# Patient Record
Sex: Female | Born: 1985 | Hispanic: Yes | Marital: Married | Smoking: Former smoker
Health system: Southern US, Community
[De-identification: ages and names within clinical notes are randomized; demographics above are authoritative.]

---

## 2006-11-23 ENCOUNTER — Encounter (INDEPENDENT_AMBULATORY_CARE_PROVIDER_SITE_OTHER): Payer: Self-pay | Admitting: Family Medicine

## 2006-11-23 ENCOUNTER — Ambulatory Visit: Payer: Self-pay | Admitting: Family Medicine

## 2006-11-23 LAB — CONVERTED CEMR LAB
Antibody Screen: NEGATIVE
Eosinophils Absolute: 0 10*3/uL (ref 0.0–0.7)
Eosinophils Relative: 0 % (ref 0–5)
HCT: 38.7 % (ref 36.0–46.0)
Hemoglobin: 13.1 g/dL (ref 12.0–15.0)
Hepatitis B Surface Ag: NEGATIVE
Lymphocytes Relative: 25 % (ref 12–46)
Lymphs Abs: 2.4 10*3/uL (ref 0.7–3.3)
MCV: 90.4 fL (ref 78.0–100.0)
Monocytes Absolute: 0.7 10*3/uL (ref 0.2–0.7)
Monocytes Relative: 8 % (ref 3–11)
RBC: 4.28 M/uL (ref 3.87–5.11)
Rh Type: POSITIVE
WBC: 9.6 10*3/uL (ref 4.0–10.5)

## 2006-11-30 ENCOUNTER — Ambulatory Visit: Payer: Self-pay | Admitting: Family Medicine

## 2006-11-30 ENCOUNTER — Encounter (INDEPENDENT_AMBULATORY_CARE_PROVIDER_SITE_OTHER): Payer: Self-pay | Admitting: Family Medicine

## 2006-11-30 LAB — CONVERTED CEMR LAB
Chlamydia, DNA Probe: NEGATIVE
GC Probe Amp, Genital: NEGATIVE

## 2006-12-04 ENCOUNTER — Ambulatory Visit (HOSPITAL_COMMUNITY): Admission: RE | Admit: 2006-12-04 | Discharge: 2006-12-04 | Payer: Self-pay | Admitting: Family Medicine

## 2007-01-01 ENCOUNTER — Ambulatory Visit: Payer: Self-pay | Admitting: Family Medicine

## 2007-01-22 ENCOUNTER — Ambulatory Visit (HOSPITAL_COMMUNITY): Admission: RE | Admit: 2007-01-22 | Discharge: 2007-01-22 | Payer: Self-pay | Admitting: Family Medicine

## 2007-01-23 ENCOUNTER — Telehealth (INDEPENDENT_AMBULATORY_CARE_PROVIDER_SITE_OTHER): Payer: Self-pay | Admitting: Family Medicine

## 2007-01-23 ENCOUNTER — Encounter (INDEPENDENT_AMBULATORY_CARE_PROVIDER_SITE_OTHER): Payer: Self-pay | Admitting: Family Medicine

## 2007-02-18 ENCOUNTER — Ambulatory Visit: Payer: Self-pay | Admitting: Family Medicine

## 2007-04-04 ENCOUNTER — Ambulatory Visit: Payer: Self-pay | Admitting: Family Medicine

## 2007-04-04 ENCOUNTER — Encounter (INDEPENDENT_AMBULATORY_CARE_PROVIDER_SITE_OTHER): Payer: Self-pay | Admitting: *Deleted

## 2007-04-17 ENCOUNTER — Ambulatory Visit: Payer: Self-pay | Admitting: Family Medicine

## 2007-05-03 ENCOUNTER — Encounter: Payer: Self-pay | Admitting: *Deleted

## 2007-05-16 ENCOUNTER — Ambulatory Visit: Payer: Self-pay | Admitting: Family Medicine

## 2007-05-16 DIAGNOSIS — R5383 Other fatigue: Secondary | ICD-10-CM

## 2007-05-16 DIAGNOSIS — R5381 Other malaise: Secondary | ICD-10-CM

## 2007-05-22 ENCOUNTER — Encounter (INDEPENDENT_AMBULATORY_CARE_PROVIDER_SITE_OTHER): Payer: Self-pay | Admitting: Family Medicine

## 2007-05-22 ENCOUNTER — Ambulatory Visit: Payer: Self-pay | Admitting: Family Medicine

## 2007-05-22 LAB — CONVERTED CEMR LAB: Chlamydia, DNA Probe: NEGATIVE

## 2007-05-28 ENCOUNTER — Telehealth: Payer: Self-pay | Admitting: *Deleted

## 2007-05-30 ENCOUNTER — Encounter (INDEPENDENT_AMBULATORY_CARE_PROVIDER_SITE_OTHER): Payer: Self-pay | Admitting: Family Medicine

## 2007-05-31 ENCOUNTER — Ambulatory Visit: Payer: Self-pay | Admitting: Family Medicine

## 2007-05-31 LAB — CONVERTED CEMR LAB: Glucose, Urine, Semiquant: NEGATIVE

## 2007-06-04 ENCOUNTER — Encounter: Payer: Self-pay | Admitting: Family Medicine

## 2007-06-04 ENCOUNTER — Ambulatory Visit: Payer: Self-pay | Admitting: Sports Medicine

## 2007-06-04 ENCOUNTER — Encounter: Payer: Self-pay | Admitting: *Deleted

## 2007-06-04 LAB — CONVERTED CEMR LAB
Glucose, Urine, Semiquant: NEGATIVE
Protein, U semiquant: NEGATIVE

## 2007-06-10 ENCOUNTER — Telehealth (INDEPENDENT_AMBULATORY_CARE_PROVIDER_SITE_OTHER): Payer: Self-pay | Admitting: Family Medicine

## 2007-06-12 ENCOUNTER — Inpatient Hospital Stay (HOSPITAL_COMMUNITY): Admission: AD | Admit: 2007-06-12 | Discharge: 2007-06-12 | Payer: Self-pay | Admitting: Obstetrics and Gynecology

## 2007-06-12 ENCOUNTER — Ambulatory Visit: Payer: Self-pay | Admitting: Family Medicine

## 2007-06-12 ENCOUNTER — Ambulatory Visit: Payer: Self-pay | Admitting: Obstetrics and Gynecology

## 2007-06-12 LAB — CONVERTED CEMR LAB

## 2007-06-14 ENCOUNTER — Encounter: Payer: Self-pay | Admitting: *Deleted

## 2007-06-20 ENCOUNTER — Ambulatory Visit: Payer: Self-pay | Admitting: Family Medicine

## 2007-06-20 ENCOUNTER — Ambulatory Visit: Payer: Self-pay | Admitting: Gynecology

## 2007-06-20 ENCOUNTER — Inpatient Hospital Stay (HOSPITAL_COMMUNITY): Admission: AD | Admit: 2007-06-20 | Discharge: 2007-06-23 | Payer: Self-pay | Admitting: Family Medicine

## 2007-08-07 ENCOUNTER — Ambulatory Visit: Payer: Self-pay | Admitting: Family Medicine

## 2007-08-07 DIAGNOSIS — N898 Other specified noninflammatory disorders of vagina: Secondary | ICD-10-CM | POA: Insufficient documentation

## 2007-08-07 LAB — CONVERTED CEMR LAB

## 2007-09-06 ENCOUNTER — Telehealth (INDEPENDENT_AMBULATORY_CARE_PROVIDER_SITE_OTHER): Payer: Self-pay | Admitting: Family Medicine

## 2008-01-03 ENCOUNTER — Encounter (INDEPENDENT_AMBULATORY_CARE_PROVIDER_SITE_OTHER): Payer: Self-pay | Admitting: Family Medicine

## 2008-01-03 ENCOUNTER — Ambulatory Visit: Payer: Self-pay | Admitting: Family Medicine

## 2008-01-03 DIAGNOSIS — N912 Amenorrhea, unspecified: Secondary | ICD-10-CM

## 2008-01-03 DIAGNOSIS — R634 Abnormal weight loss: Secondary | ICD-10-CM

## 2008-01-03 LAB — CONVERTED CEMR LAB: Whiff Test: NEGATIVE

## 2008-01-08 LAB — CONVERTED CEMR LAB
ALT: 14 units/L (ref 0–35)
AST: 15 units/L (ref 0–37)
Albumin: 4.5 g/dL (ref 3.5–5.2)
Alkaline Phosphatase: 136 units/L — ABNORMAL HIGH (ref 39–117)
BUN: 10 mg/dL (ref 6–23)
CO2: 22 meq/L (ref 19–32)
Calcium: 9 mg/dL (ref 8.4–10.5)
Chlamydia, DNA Probe: NEGATIVE
Chloride: 106 meq/L (ref 96–112)
Creatinine, Ser: 0.48 mg/dL (ref 0.40–1.20)
GC Probe Amp, Genital: NEGATIVE
Glucose, Bld: 86 mg/dL (ref 70–99)
HCT: 41.6 % (ref 36.0–46.0)
Hemoglobin: 13.6 g/dL (ref 12.0–15.0)
MCHC: 32.7 g/dL (ref 30.0–36.0)
MCV: 91.8 fL (ref 78.0–100.0)
Platelets: 291 10*3/uL (ref 150–400)
Potassium: 3.8 meq/L (ref 3.5–5.3)
RBC: 4.53 M/uL (ref 3.87–5.11)
RDW: 13.8 % (ref 11.5–15.5)
Sodium: 140 meq/L (ref 135–145)
TSH: 0.963 microintl units/mL (ref 0.350–4.50)
Total Bilirubin: 0.4 mg/dL (ref 0.3–1.2)
Total Protein: 7.7 g/dL (ref 6.0–8.3)
WBC: 13.2 10*3/uL — ABNORMAL HIGH (ref 4.0–10.5)

## 2008-01-29 ENCOUNTER — Encounter (INDEPENDENT_AMBULATORY_CARE_PROVIDER_SITE_OTHER): Payer: Self-pay | Admitting: Family Medicine

## 2008-06-22 ENCOUNTER — Encounter (INDEPENDENT_AMBULATORY_CARE_PROVIDER_SITE_OTHER): Payer: Self-pay | Admitting: Family Medicine

## 2009-04-21 ENCOUNTER — Ambulatory Visit (HOSPITAL_COMMUNITY): Admission: RE | Admit: 2009-04-21 | Discharge: 2009-04-21 | Payer: Self-pay | Admitting: Obstetrics & Gynecology

## 2009-06-26 ENCOUNTER — Inpatient Hospital Stay (HOSPITAL_COMMUNITY): Admission: AD | Admit: 2009-06-26 | Discharge: 2009-06-26 | Payer: Self-pay | Admitting: Obstetrics & Gynecology

## 2009-06-26 ENCOUNTER — Ambulatory Visit: Payer: Self-pay | Admitting: Advanced Practice Midwife

## 2009-06-27 ENCOUNTER — Inpatient Hospital Stay (HOSPITAL_COMMUNITY): Admission: AD | Admit: 2009-06-27 | Discharge: 2009-06-29 | Payer: Self-pay | Admitting: Obstetrics & Gynecology

## 2009-06-27 ENCOUNTER — Ambulatory Visit: Payer: Self-pay | Admitting: Obstetrics and Gynecology

## 2009-12-16 IMAGING — US US OB COMP +14 WK
2 series · 14 of 28 positions shown · non-contrast
Comparison: none

OBSTETRICAL ULTRASOUND:
 This ultrasound exam was performed in the [HOSPITAL] Ultrasound Department.  The OB US report was generated in the AS system, and faxed to the ordering physician.  This report is also available in [HOSPITAL]?s AccessANYware and in [REDACTED] PACS.

[Series 1: us ob comp +14 wk · 13 of 61 slices shown (1 of 2)]
[im 3/61]
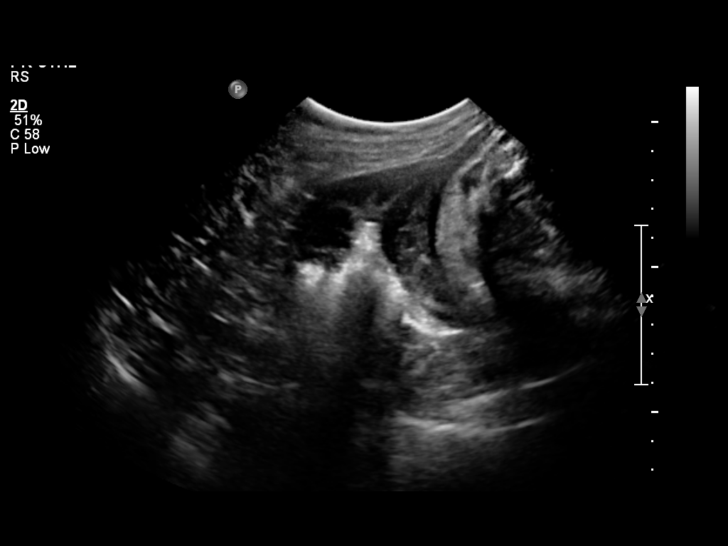
[im 7/61]
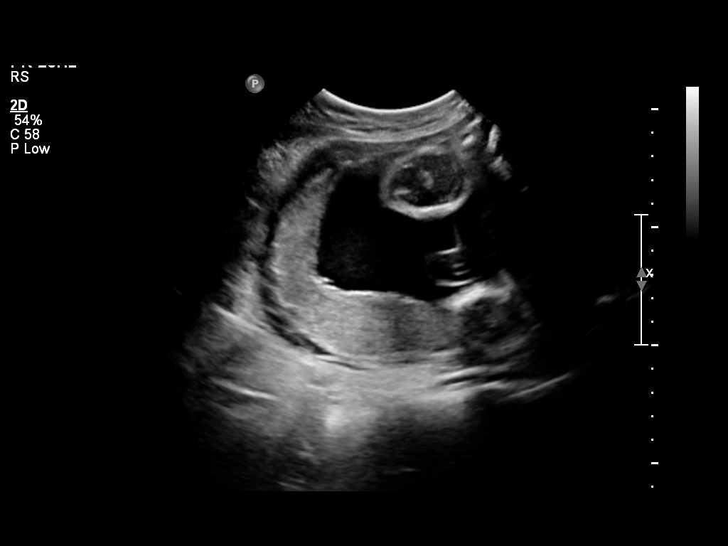
[im 12/61]
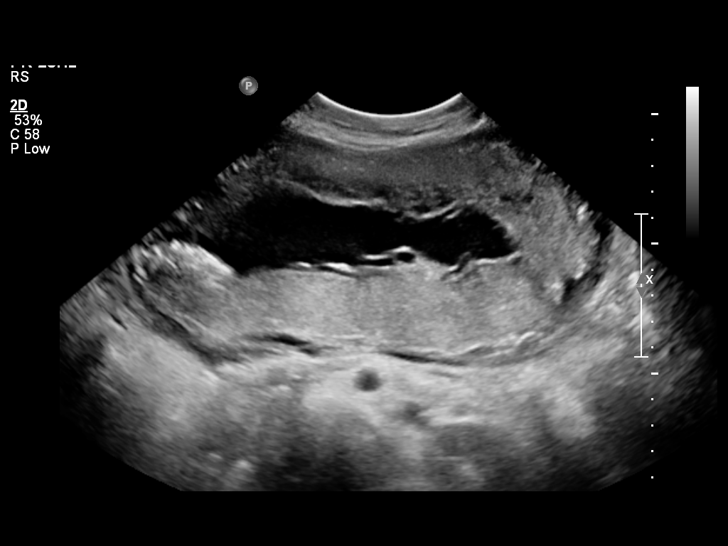
[im 17/61]
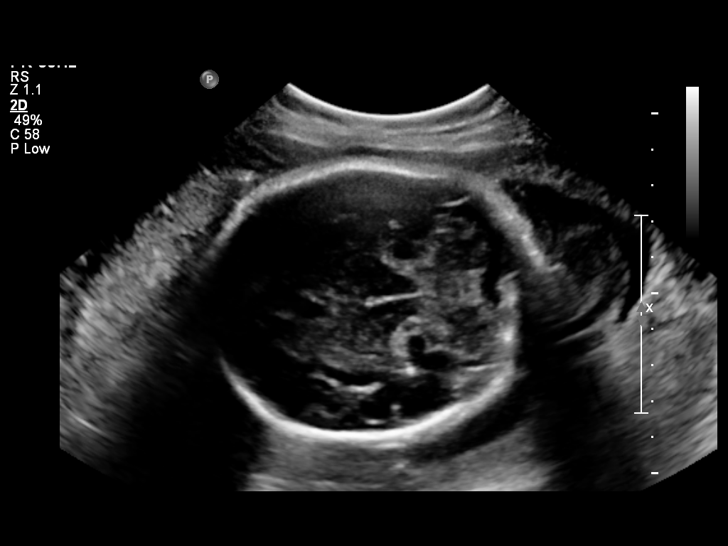
[im 21/61]
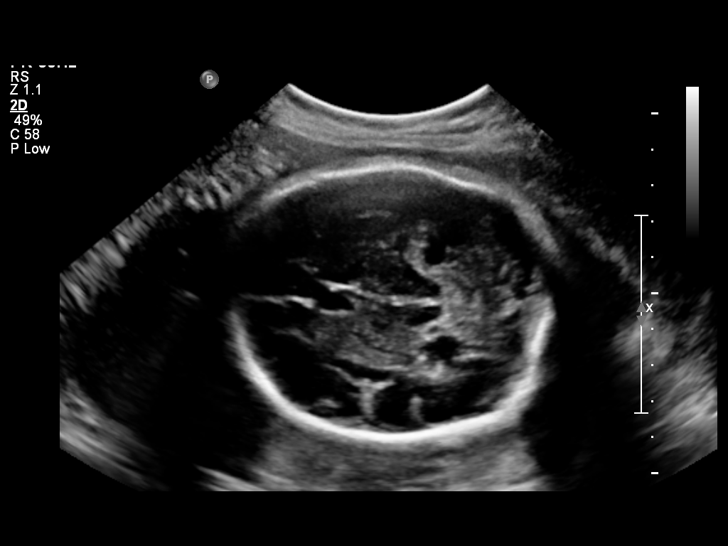
[im 26/61]
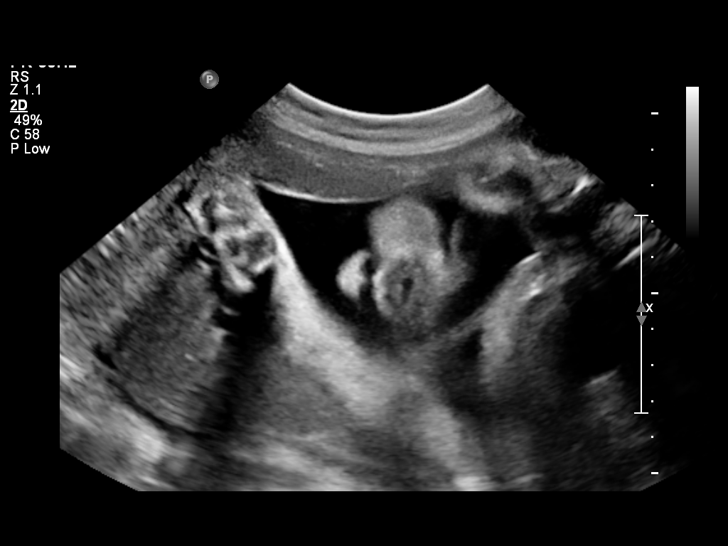
[im 30/61]
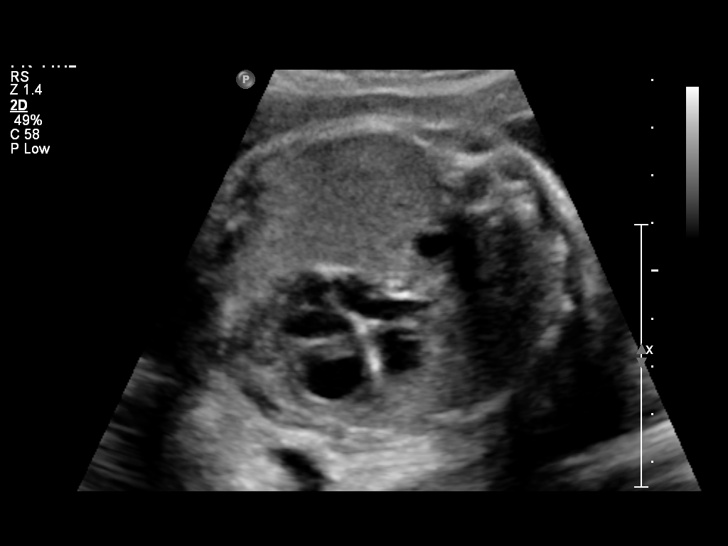
[im 35/61]
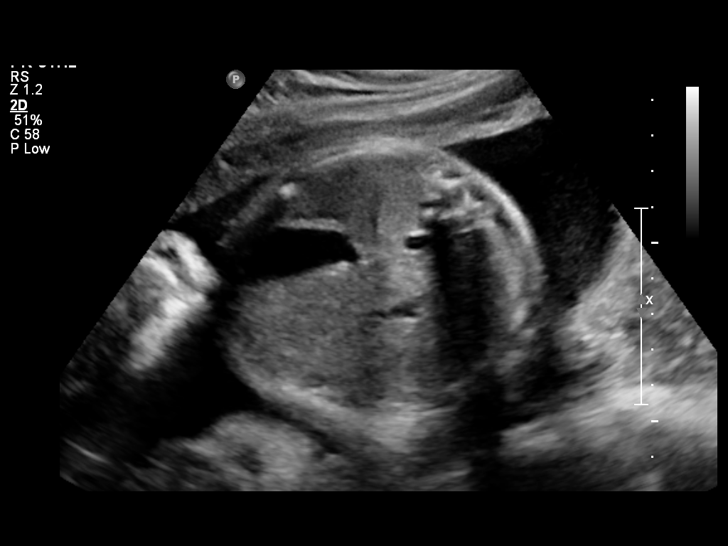
[im 40/61]
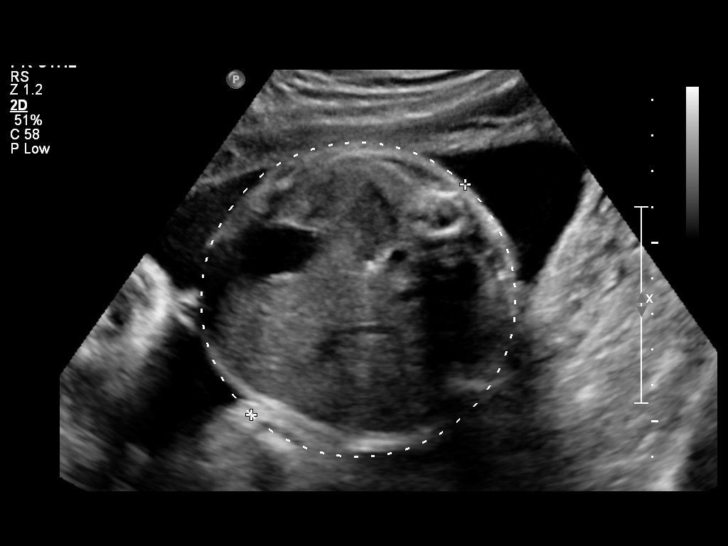
[im 44/61]
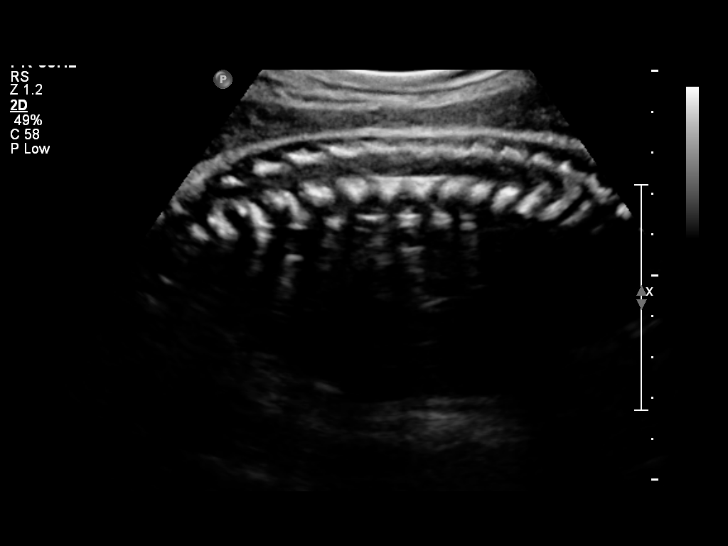
[im 49/61]
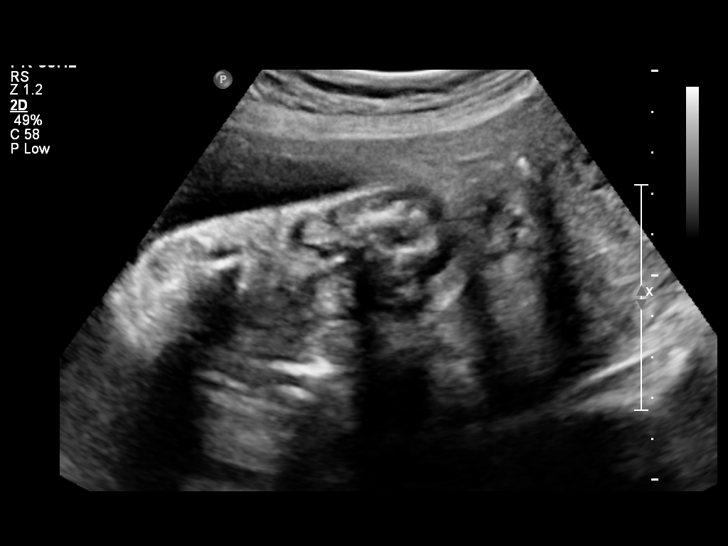
[im 54/61]
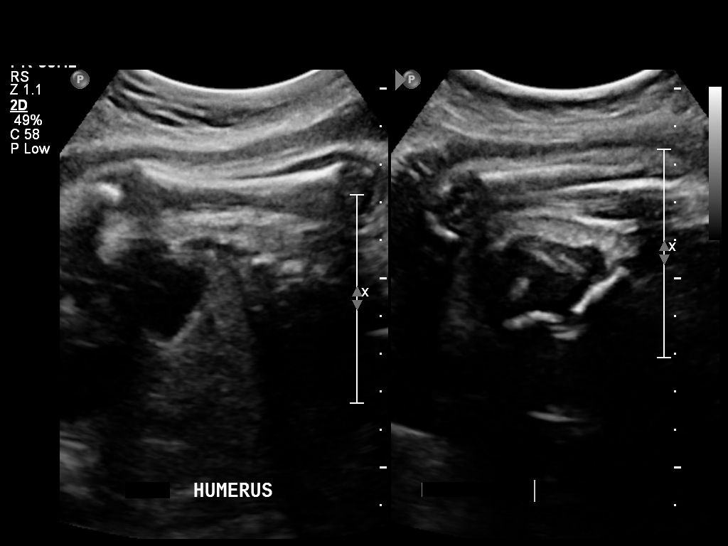
[im 58/61]
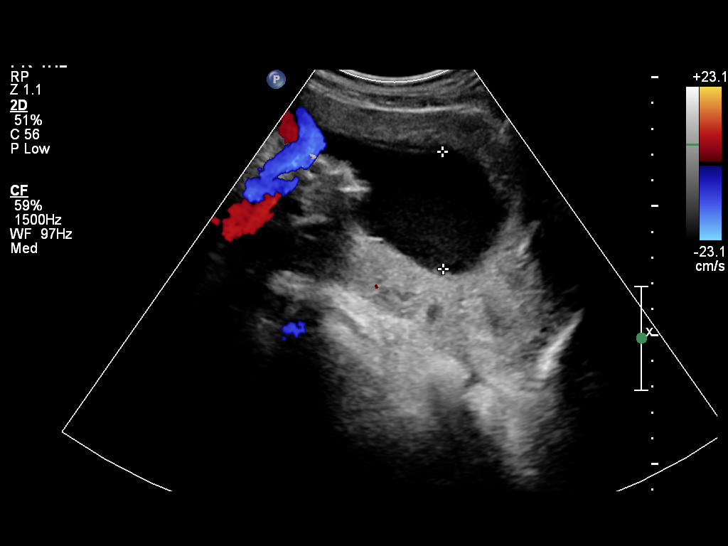

[Series 1: us ob comp +14 wk · 1 of 2 slices shown (2 of 2)]
[im 1/2]
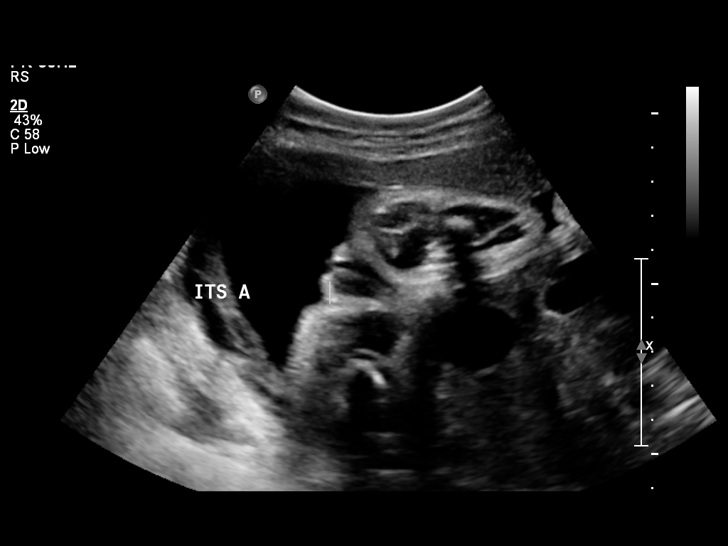

[14 of 28 positions shown; findings below may reference images not displayed]

IMPRESSION: See AS Obstetric US report.

## 2010-08-28 LAB — CBC
Hemoglobin: 10.6 g/dL — ABNORMAL LOW (ref 12.0–15.0)
Platelets: 182 10*3/uL (ref 150–400)
RBC: 3.37 MIL/uL — ABNORMAL LOW (ref 3.87–5.11)
RBC: 4.11 MIL/uL (ref 3.87–5.11)
RDW: 15.4 % (ref 11.5–15.5)
WBC: 12.6 10*3/uL — ABNORMAL HIGH (ref 4.0–10.5)
WBC: 13.7 10*3/uL — ABNORMAL HIGH (ref 4.0–10.5)

## 2010-08-28 LAB — RPR: RPR Ser Ql: NONREACTIVE

## 2011-03-02 LAB — CBC
HCT: 30.5 — ABNORMAL LOW
HCT: 40.4
Hemoglobin: 14.4
MCHC: 35
MCHC: 35.6
MCV: 93.3
MCV: 93.3
Platelets: 131 — ABNORMAL LOW
RBC: 4.33
WBC: 13.3 — ABNORMAL HIGH

## 2011-06-13 NOTE — L&D Delivery Note (Signed)
Delivery Summary for Primitivo Gauze  Labor Events:   Preterm labor:   Rupture date:   Rupture time:   Rupture type: Artificial  Fluid Color:   Induction:   Augmentation:   Complications:   Cervical ripening:          Delivery:   Episiotomy:   Lacerations:   Repair suture:   Repair # of packets:   Blood loss (ml):    Information for the patient's newborn:  Holland, Kottler [[id number]]    Delivery 05/13/2012 1:50 PM by  Vaginal, Spontaneous Delivery Sex:  female Gestational Age: <None> Delivery Clinician:  Danae Orleans Living?: Yes        APGARS  One minute Five minutes Ten minutes  Skin color:        Heart rate:        Grimace:        Muscle tone:        Breathing:        Totals:         Presentation/position: Vertex   Occiput Anterior Resuscitation: None  Cord information: 3 vessels   Disposition of cord blood: No    Blood gases sent? No Complications: None  Placenta: Delivered: 05/13/2012 1:54 PM  Spontaneous  Intact appearance Newborn Measurements: Weight:   Height:   Head circumference:   Chest circumference:   Other providers: Delivery Nurse Deliah Boston  Additional  information: Forceps:   Vacuum:   Breech:   Observed anomalies        Delivery Note C/C +2 at 1236 and had trial of pushing with poor effort. At 1315 began pushing with descent. At 1:50 PM a viable and healthy female was delivered via Vaginal, Spontaneous Delivery (Presentation: ; Occiput Anterior).  APGAR: , ; weight .   Placenta status: Intact, Spontaneous.  Trailing membranes and fragment manually removed. Cord: 3 vessels with the following complications: None.    Anesthesia: Epidural  Episiotomy: None Lacerations: None Suture Repair: n/a Est. Blood Loss (mL): 200  Mom to postpartum.  Baby to nursery-stable.  Skyy Nilan 05/13/2012, 2:01 PM

## 2012-05-01 ENCOUNTER — Ambulatory Visit (INDEPENDENT_AMBULATORY_CARE_PROVIDER_SITE_OTHER): Payer: Self-pay | Admitting: Obstetrics and Gynecology

## 2012-05-01 ENCOUNTER — Encounter: Payer: Self-pay | Admitting: Obstetrics and Gynecology

## 2012-05-01 VITALS — BP 110/72

## 2012-05-01 DIAGNOSIS — O2390 Unspecified genitourinary tract infection in pregnancy, unspecified trimester: Secondary | ICD-10-CM

## 2012-05-01 DIAGNOSIS — O0930 Supervision of pregnancy with insufficient antenatal care, unspecified trimester: Secondary | ICD-10-CM

## 2012-05-01 DIAGNOSIS — Z3480 Encounter for supervision of other normal pregnancy, unspecified trimester: Secondary | ICD-10-CM

## 2012-05-01 DIAGNOSIS — Z23 Encounter for immunization: Secondary | ICD-10-CM

## 2012-05-01 LAB — POCT URINALYSIS DIP (DEVICE)
Hgb urine dipstick: NEGATIVE
Protein, ur: NEGATIVE mg/dL
Specific Gravity, Urine: 1.01 (ref 1.005–1.030)
Urobilinogen, UA: 0.2 mg/dL (ref 0.0–1.0)
pH: 6.5 (ref 5.0–8.0)

## 2012-05-01 MED ORDER — INFLUENZA VIRUS VACC SPLIT PF IM SUSP
0.5000 mL | Freq: Once | INTRAMUSCULAR | Status: AC
  Administered 2012-05-01: 0.5 mL via INTRAMUSCULAR

## 2012-05-01 NOTE — Progress Notes (Signed)
Here for initial ob visit. Referred her from [facility]. Not sure of LMP- sometime in February or March.  States noticed leaking milk, nausea and no period so she knew she was pregnant. Will do upt today to verify pregnancy. C/o intermittent pelvic pressure and feels "like a palpation and it lasts about 5 minutes. Used Surveyor, quantity . Given new patient information. Patient unsure of prepregnancy weight.

## 2012-05-01 NOTE — Patient Instructions (Signed)
Embarazo  Tercer trimestre  (Pregnancy - Third Trimester) El tercer trimestre del embarazo (los ltimos 3 meses) es el perodo en el cual tanto usted como su beb crecen con ms rapidez. El beb alcanza un largo de aproximadamente 50 cm. y pesa entre 2,700 y 4,500 kg. El beb gana ms tejido graso y est listo para la vida fuera del cuerpo de la madre. Mientras estn en el interior, los bebs tienen perodos de sueo y vigilia, succionan el pulgar y tienen hipo. Quizs sienta pequeas contracciones del tero. Este es el falso trabajo de parto. Tambin se las conoce como contracciones de Braxton-Hicks . Es como una prctica del parto. Los problemas ms habituales de esta etapa del embarazo incluyen mayor dificultad para respirar, hinchazn de las manos y los pies por retencin de lquidos y la necesidad de orinar con ms frecuencia debido a que el tero y el beb presionan sobre la vejiga.  EXAMENES PRENATALES   Durante los exmenes prenatales, deber seguir realizndose anlisis de sangre. Estas pruebas se realizan para controlar su salud y la del beb. Los anlisis de sangre se realizan para conocer los niveles de algunos compuestos de la sangre (hemoglobina). La anemia (bajo nivel de hemoglobina) es frecuente durante el embarazo. Para prevenirla, se administran hierro y vitaminas. Tambin le tomarn nuevas anlisis para descartar diabetes. Podrn repetirle algunas de las pruebas que le hicieron previamente.  En cada visita le medirn el tamao del tero. Esto permite asegurar que el beb se desarrolla adecuadamente, segn la fecha del embarazo.  Le controlarn la presin arterial en cada visita prenatal. Esto es para asegurarse de que no sufre toxemia.  Le harn un anlisis de orina en cada visita prenatal, para descartar infecciones, diabetes y la presencia de protenas.  Tambin en cada visita controlarn su peso. Esto se realiza para asegurarse que aumenta de peso al ritmo indicado y que usted y su  beb evolucionan normalmente.  En algunas ocasiones se realiza una prueba de ultrasonido para confirmar el correcto desarrollo y evolucin del beb. Esta prueba se realiza con ondas sonoras inofensivas para el beb, de modo que el profesional pueda calcular ms precisamente la fecha del parto.  Analice con su mdico los analgsicos y la anestesia que recibir durante el trabajo de parto y el parto.  Comente la posibilidad de que necesite una cesrea y qu anestesia se recibir.  Informe a su mdico si sufre violencia familiar mental o fsica. A veces, se indica la prueba especializada sin estrs, la prueba de tolerancia a las contracciones y el perfil biofsico para asegurarse de que el beb no tiene problemas. El estudio del lquido amnitico que rodea al beb se llama amniocentesis. El lquido amnitico se obtiene introduciendo una aguja en el vientre (abdomen ). En ocasiones se lleva a cabo cerca del final del embarazo, si es necesario inducir a un parto. En este caso se realiza para asegurarse que los pulmones del beb estn lo suficientemente maduros como para que pueda vivir fuera del tero. Si los pulmones no han madurado y es peligroso que el beb nazca, se administrar a la madre una inyeccin de cortisona , 1 a 2 das antes del parto. . Esto ayuda a que los pulmones del beb maduren y sea ms seguro su nacimiento.  CAMBIOS QUE OCURREN EN EL TERCER TRIMESTRE DEL EMBARAZO  Su organismo atravesar numerosos cambios durante el embarazo. Estos pueden variar de una persona a otra. Converse con el profesional que la asiste acerca los cambios que   usted note y que la preocupen.   Durante el ltimo trimestre probablemente sienta un aumento del apetito. Es normal tener "antojos" de ciertas comidas. Esto vara de una persona a otra y de un embarazo a otro.  Podrn aparecer las primeras estras en las caderas, abdomen y mamas. Estos son cambios normales del cuerpo durante el embarazo. No existen  medicamentos ni ejercicios que puedan prevenir estos cambios.  La constipacin puede tratarse con un laxante o agregando fibra a su dieta. Beber grandes cantidades de lquidos, tomar fibras en forma de vegetales, frutas y granos integrales es de gran ayuda.  Tambin es beneficioso practicar actividad fsica. Si ha sido una persona activa hasta el embarazo, podr continuar con la mayora de las actividades durante el mismo. Si ha sido menos activa, puede ser beneficioso que comience con un programa de ejercicios, como realizar caminatas. Consulte con el profesional que la asiste antes de comenzar un programa de ejercicios.  Evite el consumo de cigarrillos, el alcohol, los medicamentos no recetados y las "drogas de la calle" durante el embarazo. Estas sustancias qumicas afectan la formacin y el desarrollo del beb. Evite estas sustancias durante todo el embarazo para asegurar el nacimiento de un beb sano.  Podr sentir dolor de espalda, tener vrices en las venas y hemorroides, o si ya los sufra, pueden empeorar.  Durante el tercer trimestre se cansar con ms facilidad, lo cual es normal.  Los movimientos del beb pueden ser ms fuertes y con ms frecuencia.  Puede que note dificultades para respirar normalmente.  El ombligo puede salir hacia afuera.  A veces sale una secrecin amarilla de las mamas, que se llama calostro.  Podr aparecer una secrecin mucosa con sangre. Esto suele ocurrir entre unos pocos das y una semana antes del parto. INSTRUCCIONES PARA EL CUIDADO EN EL HOGAR   Cumpla con las citas de control. Siga las indicaciones del mdico con respecto al uso de medicamentos, los ejercicios y la dieta.  Durante el embarazo debe obtener nutrientes para usted y para su beb. Consuma alimentos balanceados a intervalos regulares. Elija alimentos como carne, pescado, leche y otros productos lcteos descremados, vegetales, frutas, panes integrales y cereales. El mdico le informar  cul es el aumento de peso ideal.  Las relaciones sexuales pueden continuarse hasta casi el final del embarazo, si no se presentan otros problemas como prdida prematura (antes de tiempo) de lquido amnitico, hemorragia vaginal o dolor en el vientre (abdominal).  Realice actividad fsica todos los das, si no tiene restricciones. Consulte con el profesional que la asiste si no sabe con certeza si determinados ejercicios son seguros. El mayor aumento de peso se producir en los ltimos 2 trimestres del embarazo. El ejercicio ayuda a:  Controlar su peso.  Mantenerse en forma para el trabajo de parto y el parto .  Perder peso despus del parto.  Haga reposo con frecuencia, con las piernas elevadas, o segn lo necesite para evitar los calambres y el dolor de cintura.  Use un buen sostn o como los que se usan para hacer deportes para aliviar la sensibilidad de las mamas. Tambin puede serle til si lo usa mientras duerme. Si pierde calostro, podr utilizar apsitos en el sostn.  No utilice la baera con agua caliente, baos turcos y saunas.  Colquese el cinturn de seguridad cuando conduzca. Este la proteger a usted y al beb en caso de accidente.  Evite comer carne cruda y el contacto con los utensilios y desperdicios de los gatos. Estos elementos   contienen grmenes que pueden causar defectos de nacimiento en el beb.  Es fcil perder algo de orina durante el embarazo. Apretar y fortalecer los msculos de la pelvis la ayudar con este problema. Practique detener la miccin cuando est en el bao. Estos son los mismos msculos que necesita fortalecer. Son tambin los mismos msculos que utiliza cuando trata de evitar despedir gases. Puede practicar apretando estos msculos diez veces, y repetir esto tres veces por da aproximadamente. Una vez que conozca qu msculos debe apretar, no realice estos ejercicios durante la miccin. Puede favorecerle una infeccin si la orina vuelve hacia  atrs.  Pida ayuda si tienen necesidades financieras, teraputicas o nutricionales. El profesional podr ayudarla con respecto a estas necesidades, o derivarla a otros especialistas.  Haga una lista de nmeros telefnicos de emergencia y tngalos disponibles.  Planifique como obtener ayuda de familiares o amigos cuando regrese a casa desde el hospital.  Hacer un ensayo sobre la partida al hospital.  Tome clases prenatales con el padre para entender, practicar y hacer preguntas sobre el trabajo de parto y el alumbramiento.  Preparar la habitacin del beb / busque una guardera.  No viaje fuera de la ciudad a menos que sea absolutamente necesario y con el asesoramiento de su mdico.  Use slo zapatos de tacn bajo o sin tacn para tener mejor equilibrio y evitar cadas. USO DE MEDICAMENTOS Y CONSUMO DE DROGAS DURANTE EL EMBARAZO   Tome las vitaminas apropiadas para esta etapa tal como se le indic. Las vitaminas deben contener un miligramo de cido flico. Guarde todas las vitaminas fuera del alcance de los nios. La ingestin de slo un par de vitaminas o tabletas que contengan hierro pueden ocasionar la muerte en un beb o en un nio pequeo.  Evite el uso de todos los medicamentos, incluyendo hierbas, medicamentos de venta libre, sin receta o que no hayan sido sugeridos por su mdico. Slo tome medicamentos de venta libre o medicamentos recetados para el dolor, el malestar o fiebre como lo indique su mdico. No tome aspirina, ibuprofeno (Motrin, Advil, Nuprin) o naproxeno (Aleve) excepto que su mdico se lo indique.  Infrmele al profesional si consume alguna droga.  El alcohol se relaciona con ciertos defectos congnitos. Incluye el sndrome de alcoholismo fetal. Debe evitar absolutamente el consumo de alcohol, en cualquier forma. El fumar produce baja tasa de natalidad y bebs prematuros.  Las drogas ilegales o de la calle son muy perjudiciales para el beb. Estn absolutamente  prohibidas. Un beb que nace de una madre adicta, ser adicto al nacer. Ese beb tendr los mismos sntomas de abstinencia que un adulto. SOLICITE ATENCIN MDICA SI:  Tiene preguntas o preocupaciones relacionadas con el embarazo. Es mejor que llame para formular las preguntas si no puede esperar hasta la prxima visita, que sentirse preocupada por ellas.  DECISIONES ACERCA DE LA CIRCUNCISIN  Usted puede saber o no cul es el sexo de su beb. Si ya sabe que ser un varn, este es el momento de pensar acerca de la circuncisin. La circuncisin es la extirpacin del prepucio. Esta es la piel que cubre el extremo sensible del pene. No hay un motivo mdico que lo justifique. Generalmente la decisin se toma segn lo que sea popular en ese momento, o segn creencias religiosas. Podr conversar estos temas con su mdico o con el pediatra.  SOLICITE ATENCIN MDICA DE INMEDIATO SI:   La temperatura oral le sube a ms de 102 F (38.9 C) o lo que su mdico le   indique.  Tiene una prdida de lquido por la vagina (canal de parto). Si sospecha una ruptura de las membranas, tmese la temperatura y llame al profesional para informarlo sobre esto.  Observa unas pequeas manchas, una hemorragia vaginal o elimina cogulos. Notifique al profesional acerca de la cantidad y de cuntos apsitos est utilizando.  Presenta un olor desagradable en la secrecin vaginal y observa un cambio en el color, de transparente a blanco.  Ha vomitado durante ms de 24 horas.  Siente escalofros o le sube la fiebre.  Le falta el aire.  Siente ardor al orinar.  Baja o sube ms de 2 libras (900 g), o segn lo indicado por el profesional que la asiste.  Observa que sbitamente se le hinchan el rostro, las manos, los pies o las piernas.  Siente dolor en el vientre (abdominal). Las molestias en el ligamento redondo son una causa benigna frecuente de dolor abdominal durante el embarazo. El profesional que la asiste deber  evaluarla.  Presenta dolor de cabeza intenso que no se alivia.  Tiene problemas visuales, visin doble o borrosa.  Si no siente los movimientos del beb durante ms de 1 hora. Si piensa que el beb no se mueve tanto como lo haca habitualmente, coma algo que contenga azcar y recustese sobre el lado izquierdo durante una hora. El beb debe moverse al menos 4  5 veces por hora. Comunquese inmediatamente si el beb se mueve menos que lo indicado.  Se cae, se ve involucrada en un accidente automovilstico o sufre algn tipo de traumatismo.  En su hogar hay violencia mental o fsica. Document Released: 03/08/2005 Document Revised: 11/28/2011 [employer] Patient Information 2013 [employer].  

## 2012-05-01 NOTE — Progress Notes (Signed)
.    Subjective:    Jamie Li is a [id number] Unknown being seen today for her first obstetrical visit.  Her obstetrical history is significant for [facility], unsure LMP "March". No known pregnancy problems except groin/pelvic sharp intermittent discomfort worse with moving.  Patient does intend to breast feed. Pregnancy history fully reviewed.  Patient reports RLP.  Filed Vitals:   05/01/12 1034  BP: 110/72    HISTORY: OB History    Grav Para Term Preterm Abortions TAB SAB Ect Mult Living   3 2 2       2      # Outc Date GA Lbr Len/2nd Wgt Sex Del Anes PTL Lv   1 TRM 1/09 [redacted]w[redacted]d  6lb9oz(2.977kg) M SVD EPI  Yes   Comments: no complications , born at [facility]   2 [facility] 1/11 [redacted]w[redacted]d  6lb(2.722kg) F SVD   Yes   Comments: no complications, born at [facility]   3 CUR              History reviewed. No pertinent past medical history. History reviewed. No pertinent past surgical history. History reviewed. No pertinent family history.   Exam    Uterus:     Pelvic Exam:    Perineum: Normal Perineum   Vulva: normal, Bartholin's, Urethra, Skene's normal   Vagina:  normal mucosa, creamy white-yellow D/C       Cervix: multiparous appearance   Adnexa: not evaluated   Bony Pelvis: average  System: Breast:  normal appearance, no masses or tenderness   Skin: normal coloration and turgor, no rashes    Neurologic: oriented, normal, grossly non-focal   Extremities: no erythema, induration, or nodules, DTRs 1+   HEENT PERRLA   Mouth/Teeth mucous membranes moist, pharynx normal without lesions and dental hygiene good   Neck supple, no masses and thyroid not enlarged   Cardiovascular: regular rate and rhythm   Respiratory:  appears well, vitals normal, no respiratory distress, acyanotic, normal RR, ear and throat exam is normal, neck free of mass or lymphadenopathy, chest clear, no wheezing, crepitations, rhonchi, normal symmetric air entry   Abdomen: NT, fundus at 26-28 wk size, DT FHR 144   Urinary:  urethral meatus normal      Assessment:    Pregnancy: [id number] Patient Active Problem List  Diagnosis  . VAGINAL DISCHARGE  . AMENORRHEA  . FATIGUE  . WEIGHT LOSS  . Supervision of normal subsequent pregnancy    Insufficient PNC    Plan:     Initial labs drawn. Glucola done Flu vaccine done [facility] sent Prenatal vitamins. Problem list reviewed and updated.  Ultrasound discussed; fetal survey: scheduled anatomic scan.  Follow up in 2 weeks. 40% of 30 min visit spent on counseling and coordination of care.     Saphira Lahmann 05/01/2012 Saw pt with SNM and agree.

## 2012-05-01 NOTE — Progress Notes (Deleted)
   Subjective:    Pallavi Clifton is a [id number] Unknown being seen today for her first obstetrical visit.  Her obstetrical history is significant for two term SVD, unsure gestation and limited prenatal care with this pregnancy. Unsure if patient intend to breast feed. Pregnancy history fully reviewed.  Patient reports no complaints.  Filed Vitals:   05/01/12 1034  BP: 110/72    HISTORY: OB History    Grav Para Term Preterm Abortions TAB SAB Ect Mult Living   3 2 2       2      # Outc Date GA Lbr Len/2nd Wgt Sex Del Anes PTL Lv   1 TRM 1/09 [redacted]w[redacted]d  6lb9oz(2.977kg) M SVD EPI  Yes   Comments: no complications , born at [facility]   2 [facility] 1/11 [redacted]w[redacted]d  6lb(2.722kg) F SVD   Yes   Comments: no complications, born at [facility]   3 CUR              History reviewed. No pertinent past medical history. History reviewed. No pertinent past surgical history. History reviewed. No pertinent family history.   Exam    Uterus:     Pelvic Exam:    Perineum: No Hemorrhoids, Normal Perineum   Vulva: normal   Vagina:  normal mucosa   Cervix: no cervical motion tenderness   Adnexa: normal adnexa and no mass, fullness, tenderness   Bony Pelvis: gynecoid  System: Breast:  normal appearance for gestation, no masses or tenderness   Skin: normal coloration and turgor, no rashes    Neurologic: oriented, normal, normal mood, gait normal; reflexes normal and symmetric   Extremities: normal strength, tone, and muscle mass, ROM of all joints is normal   HEENT extra ocular movement intact   Mouth/Teeth mucous membranes moist, pharynx normal without lesions and dental hygiene good   Neck supple and no masses   Cardiovascular: regular rate and rhythm, no murmurs or gallops   Respiratory:  appears well, vitals normal, no respiratory distress, acyanotic, normal RR, ear and throat exam is normal, neck free of mass or lymphadenopathy, chest clear, no wheezing, crepitations, rhonchi, normal symmetric air entry   Abdomen: soft, non-tender; bowel sounds normal; no masses,  no organomegaly, gravid, FH = 30cm   Urinary: not assessed      Assessment:    Pregnancy: [id number] Patient Active Problem List  Diagnosis  . VAGINAL DISCHARGE  . AMENORRHEA  . FATIGUE  . WEIGHT LOSS  . Supervision of normal subsequent pregnancy  . Insufficient prenatal care  Unsure pregnancy dating      Plan:     Initial labs drawn. Prenatal vitamins. Problem list reviewed and updated. Genetic Screening: too late, advanced gestation. Ultrasound discussed; fetal survey: ordered.  Follow up in 2 weeks. 50% of 45 min visit spent on counseling and coordination of care.     Raelyn Mora, SNM 05/01/2012 Supervised by: Caren Griffins, CNM

## 2012-05-02 ENCOUNTER — Other Ambulatory Visit: Payer: Self-pay | Admitting: Obstetrics and Gynecology

## 2012-05-02 ENCOUNTER — Ambulatory Visit (HOSPITAL_COMMUNITY)
Admission: RE | Admit: 2012-05-02 | Discharge: 2012-05-02 | Disposition: A | Discharge status: Home / Self Care | Payer: Self-pay | Source: Ambulatory Visit | Attending: Obstetrics and Gynecology | Admitting: Obstetrics and Gynecology

## 2012-05-02 DIAGNOSIS — O0930 Supervision of pregnancy with insufficient antenatal care, unspecified trimester: Secondary | ICD-10-CM

## 2012-05-02 DIAGNOSIS — O4100X0 Oligohydramnios, unspecified trimester, not applicable or unspecified: Secondary | ICD-10-CM | POA: Insufficient documentation

## 2012-05-02 LAB — OBSTETRIC PANEL
Antibody Screen: NEGATIVE
Basophils Absolute: 0 10*3/uL (ref 0.0–0.1)
Basophils Relative: 0 % (ref 0–1)
Eosinophils Relative: 0 % (ref 0–5)
HCT: 36.7 % (ref 36.0–46.0)
MCHC: 34.1 g/dL (ref 30.0–36.0)
Monocytes Absolute: 0.7 10*3/uL (ref 0.1–1.0)
Neutro Abs: 7.3 10*3/uL (ref 1.7–7.7)
RDW: 14.3 % (ref 11.5–15.5)

## 2012-05-02 LAB — HIV ANTIBODY (ROUTINE TESTING W REFLEX): HIV: NONREACTIVE

## 2012-05-02 LAB — GLUCOSE TOLERANCE, 1 HOUR (50G) W/O FASTING: Glucose, 1 Hour GTT: 141 mg/dL — ABNORMAL HIGH (ref 70–140)

## 2012-05-03 ENCOUNTER — Telehealth: Payer: Self-pay | Admitting: *Deleted

## 2012-05-03 DIAGNOSIS — O4100X0 Oligohydramnios, unspecified trimester, not applicable or unspecified: Secondary | ICD-10-CM

## 2012-05-03 NOTE — Telephone Encounter (Signed)
Called pt w/[employer] interpreter # [phone].  Pt's husband answered and stated that he is @ work and [place] is not with him.  He asked if hs could give her a message. I stated that the doctor would like her to have another [place] next week to check the amount of fluid around the baby. The appt has been made for 11/27 @ 0845.  He voiced understanding and said he will give [place] the message.  I also stated that Johnetta may call back on Monday if she has questions.

## 2012-05-07 LAB — HEMOGLOBINOPATHY EVALUATION
Hgb A2 Quant: 2.8 % (ref 2.2–3.2)
Hgb F Quant: 0 % (ref 0.0–2.0)

## 2012-05-08 ENCOUNTER — Other Ambulatory Visit: Payer: Self-pay | Admitting: Family Medicine

## 2012-05-08 ENCOUNTER — Ambulatory Visit (HOSPITAL_COMMUNITY)
Admission: RE | Admit: 2012-05-08 | Discharge: 2012-05-08 | Disposition: A | Discharge status: Home / Self Care | Payer: Self-pay | Source: Ambulatory Visit | Attending: Obstetrics & Gynecology | Admitting: Obstetrics & Gynecology

## 2012-05-08 ENCOUNTER — Encounter: Payer: Self-pay | Admitting: Family Medicine

## 2012-05-08 DIAGNOSIS — O4100X0 Oligohydramnios, unspecified trimester, not applicable or unspecified: Secondary | ICD-10-CM | POA: Insufficient documentation

## 2012-05-13 ENCOUNTER — Inpatient Hospital Stay (HOSPITAL_COMMUNITY): Payer: Medicaid Other | Admitting: Anesthesiology

## 2012-05-13 ENCOUNTER — Encounter (HOSPITAL_COMMUNITY): Payer: Self-pay | Admitting: Anesthesiology

## 2012-05-13 ENCOUNTER — Inpatient Hospital Stay (HOSPITAL_COMMUNITY)
Admission: AD | Admit: 2012-05-13 | Discharge: 2012-05-15 | DRG: 775 | Disposition: A | Discharge status: Home / Self Care | Payer: Medicaid Other | Source: Ambulatory Visit | Attending: Obstetrics & Gynecology | Admitting: Obstetrics & Gynecology

## 2012-05-13 ENCOUNTER — Encounter (HOSPITAL_COMMUNITY): Payer: Self-pay | Admitting: *Deleted

## 2012-05-13 DIAGNOSIS — O4100X0 Oligohydramnios, unspecified trimester, not applicable or unspecified: Secondary | ICD-10-CM

## 2012-05-13 LAB — TYPE AND SCREEN: ABO/RH(D): O POS

## 2012-05-13 LAB — CBC
HCT: 36.2 % (ref 36.0–46.0)
MCV: 90.3 fL (ref 78.0–100.0)
RBC: 4.01 MIL/uL (ref 3.87–5.11)
WBC: 13.2 10*3/uL — ABNORMAL HIGH (ref 4.0–10.5)

## 2012-05-13 LAB — GLUCOSE, CAPILLARY: Glucose-Capillary: 94 mg/dL (ref 70–99)

## 2012-05-13 MED ORDER — ONDANSETRON HCL 4 MG/2ML IJ SOLN: 4.0000 mg | Freq: Four times a day (QID) | INTRAMUSCULAR | Status: DC | PRN

## 2012-05-13 MED ORDER — EPHEDRINE 5 MG/ML INJ: 10.0000 mg | INTRAVENOUS | Status: DC | PRN

## 2012-05-13 MED ORDER — ZOLPIDEM TARTRATE 5 MG PO TABS: 5.0000 mg | ORAL_TABLET | Freq: Every evening | ORAL | Status: DC | PRN

## 2012-05-13 MED ORDER — DIBUCAINE 1 % RE OINT
1.0000 "application " | TOPICAL_OINTMENT | RECTAL | Status: DC | PRN
  Filled 2012-05-13: qty 28

## 2012-05-13 MED ORDER — LIDOCAINE HCL (PF) 1 % IJ SOLN: 30.0000 mL | INTRAMUSCULAR | Status: DC | PRN

## 2012-05-13 MED ORDER — ONDANSETRON HCL 4 MG/2ML IJ SOLN: 4.0000 mg | INTRAMUSCULAR | Status: DC | PRN

## 2012-05-13 MED ORDER — IBUPROFEN 600 MG PO TABS
600.0000 mg | ORAL_TABLET | Freq: Four times a day (QID) | ORAL | Status: DC | PRN
  Filled 2012-05-13: qty 1

## 2012-05-13 MED ORDER — LIDOCAINE HCL (PF) 1 % IJ SOLN
30.0000 mL | INTRAMUSCULAR | Status: DC | PRN
  Filled 2012-05-13: qty 30

## 2012-05-13 MED ORDER — OXYCODONE-ACETAMINOPHEN 5-325 MG PO TABS
1.0000 | ORAL_TABLET | ORAL | Status: DC | PRN
Start: 2012-05-13 — End: 2012-05-13

## 2012-05-13 MED ORDER — PRENATAL MULTIVITAMIN CH
1.0000 | ORAL_TABLET | Freq: Every day | ORAL | Status: DC
  Administered 2012-05-14 – 2012-05-15 (×2): 1 via ORAL
  Filled 2012-05-13 (×2): qty 1

## 2012-05-13 MED ORDER — ONDANSETRON HCL 4 MG PO TABS: 4.0000 mg | ORAL_TABLET | ORAL | Status: DC | PRN

## 2012-05-13 MED ORDER — SODIUM BICARBONATE 8.4 % IV SOLN
INTRAVENOUS | Status: DC | PRN
  Administered 2012-05-13: 5 mL via EPIDURAL

## 2012-05-13 MED ORDER — PHENYLEPHRINE 40 MCG/ML (10ML) SYRINGE FOR IV PUSH (FOR BLOOD PRESSURE SUPPORT): 80.0000 ug | PREFILLED_SYRINGE | INTRAVENOUS | Status: DC | PRN

## 2012-05-13 MED ORDER — OXYCODONE-ACETAMINOPHEN 5-325 MG PO TABS
1.0000 | ORAL_TABLET | ORAL | Status: DC | PRN
  Administered 2012-05-13: 2 via ORAL
  Administered 2012-05-14: 1 via ORAL
  Filled 2012-05-13: qty 1
  Filled 2012-05-13: qty 2

## 2012-05-13 MED ORDER — OXYTOCIN BOLUS FROM INFUSION: 500.0000 mL | INTRAVENOUS | Status: DC

## 2012-05-13 MED ORDER — LANOLIN HYDROUS EX OINT: TOPICAL_OINTMENT | CUTANEOUS | Status: DC | PRN

## 2012-05-13 MED ORDER — ACETAMINOPHEN 325 MG PO TABS: 650.0000 mg | ORAL_TABLET | ORAL | Status: DC | PRN

## 2012-05-13 MED ORDER — TETANUS-DIPHTH-ACELL PERTUSSIS 5-2.5-18.5 LF-MCG/0.5 IM SUSP
0.5000 mL | Freq: Once | INTRAMUSCULAR | Status: AC
  Administered 2012-05-14: 0.5 mL via INTRAMUSCULAR
  Filled 2012-05-13 (×2): qty 0.5

## 2012-05-13 MED ORDER — SENNOSIDES-DOCUSATE SODIUM 8.6-50 MG PO TABS
2.0000 | ORAL_TABLET | Freq: Every day | ORAL | Status: DC
  Administered 2012-05-15 (×2): 2 via ORAL

## 2012-05-13 MED ORDER — WITCH HAZEL-GLYCERIN EX PADS: 1.0000 "application " | MEDICATED_PAD | CUTANEOUS | Status: DC | PRN

## 2012-05-13 MED ORDER — LACTATED RINGERS IV SOLN: 500.0000 mL | Freq: Once | INTRAVENOUS | Status: DC

## 2012-05-13 MED ORDER — FLEET ENEMA 7-19 GM/118ML RE ENEM: 1.0000 | ENEMA | RECTAL | Status: DC | PRN

## 2012-05-13 MED ORDER — CITRIC ACID-SODIUM CITRATE 334-500 MG/5ML PO SOLN: 30.0000 mL | ORAL | Status: DC | PRN

## 2012-05-13 MED ORDER — DIPHENHYDRAMINE HCL 25 MG PO CAPS: 25.0000 mg | ORAL_CAPSULE | Freq: Four times a day (QID) | ORAL | Status: DC | PRN

## 2012-05-13 MED ORDER — FENTANYL 2.5 MCG/ML BUPIVACAINE 1/10 % EPIDURAL INFUSION (WH - ANES)
INTRAMUSCULAR | Status: DC | PRN
  Administered 2012-05-13: 14 mL/h via EPIDURAL

## 2012-05-13 MED ORDER — DIPHENHYDRAMINE HCL 50 MG/ML IJ SOLN: 12.5000 mg | INTRAMUSCULAR | Status: DC | PRN

## 2012-05-13 MED ORDER — FENTANYL 2.5 MCG/ML BUPIVACAINE 1/10 % EPIDURAL INFUSION (WH - ANES)
14.0000 mL/h | INTRAMUSCULAR | Status: DC
  Filled 2012-05-13: qty 125

## 2012-05-13 MED ORDER — LACTATED RINGERS IV SOLN
INTRAVENOUS | Status: DC
  Administered 2012-05-13 (×2): via INTRAVENOUS

## 2012-05-13 MED ORDER — LACTATED RINGERS IV SOLN: 500.0000 mL | INTRAVENOUS | Status: DC | PRN

## 2012-05-13 MED ORDER — LACTATED RINGERS IV SOLN: INTRAVENOUS | Status: DC

## 2012-05-13 MED ORDER — PHENYLEPHRINE 40 MCG/ML (10ML) SYRINGE FOR IV PUSH (FOR BLOOD PRESSURE SUPPORT)
80.0000 ug | PREFILLED_SYRINGE | INTRAVENOUS | Status: DC | PRN
  Filled 2012-05-13: qty 5

## 2012-05-13 MED ORDER — IBUPROFEN 600 MG PO TABS: 600.0000 mg | ORAL_TABLET | Freq: Four times a day (QID) | ORAL | Status: DC | PRN

## 2012-05-13 MED ORDER — OXYTOCIN 40 UNITS IN LACTATED RINGERS INFUSION - SIMPLE MED
62.5000 mL/h | INTRAVENOUS | Status: DC
  Filled 2012-05-13: qty 1000

## 2012-05-13 MED ORDER — EPHEDRINE 5 MG/ML INJ
10.0000 mg | INTRAVENOUS | Status: DC | PRN
  Filled 2012-05-13: qty 4

## 2012-05-13 MED ORDER — OXYCODONE-ACETAMINOPHEN 5-325 MG PO TABS: 1.0000 | ORAL_TABLET | ORAL | Status: DC | PRN

## 2012-05-13 MED ORDER — SIMETHICONE 80 MG PO CHEW: 80.0000 mg | CHEWABLE_TABLET | ORAL | Status: DC | PRN

## 2012-05-13 MED ORDER — OXYTOCIN 40 UNITS IN LACTATED RINGERS INFUSION - SIMPLE MED
62.5000 mL/h | INTRAVENOUS | Status: DC
  Administered 2012-05-13: 999 mL/h via INTRAVENOUS

## 2012-05-13 MED ORDER — IBUPROFEN 600 MG PO TABS
600.0000 mg | ORAL_TABLET | Freq: Four times a day (QID) | ORAL | Status: DC
  Administered 2012-05-13 – 2012-05-15 (×9): 600 mg via ORAL
  Filled 2012-05-13 (×7): qty 1

## 2012-05-13 MED ORDER — BENZOCAINE-MENTHOL 20-0.5 % EX AERO
1.0000 "application " | INHALATION_SPRAY | CUTANEOUS | Status: DC | PRN
  Filled 2012-05-13: qty 56

## 2012-05-13 NOTE — H&P (Addendum)
Jamie Li is a 26 y.o. female presenting for SOL. Maternal Medical History:  Reason for admission: Reason for admission: contractions.  Reason for Admission:   nauseaContractions: Onset was 3-5 hours ago.   Frequency: regular.   Perceived severity is strong.    Fetal activity: Perceived fetal activity is normal.   Last perceived fetal movement was within the past hour.    Prenatal complications: 1.One PN visit 05/01/12. 2.Poor dating by initial scan 05/02/12 EGA [redacted]w[redacted]d, AFI 7, S;D dopplers nl; rpt [place] 05/08/12 c/w EGA [redacted]w[redacted]d and AFI of 9.  3. Abnl 1 hr OGTT 141 05/01/12 with no F/U 4. Language barrier   Prenatal Complications - Diabetes: undetermined  OB History    Grav Para Term Preterm Abortions TAB SAB Ect Mult Living   3 2 2       2     Uncomplicated NSVD x2 birth weights about 7#  History reviewed. No pertinent past medical history. History reviewed. No pertinent past surgical history. Family History: family history is not on file. Social History:  reports that she has been smoking.  She has never used smokeless tobacco. She reports that she does not drink alcohol or use illicit drugs.   Prenatal Transfer Tool  Maternal Diabetes: No undetermined. Abnl 1 hr glucola not followed up Genetic Screening: Declined late care Maternal Ultrasounds/Referrals: Normal Fetal Ultrasounds or other Referrals:  None Maternal Substance Abuse:  No Significant Maternal Medications:  None Significant Maternal Lab Results:  NoneGBS unknown Other Comments:  see above.  Review of Systems  Constitutional: Negative for fever and chills.  Gastrointestinal: Negative for nausea and vomiting.  Genitourinary: Negative for dysuria.  Skin: Negative for rash.  Neurological: Negative for headaches.  Psychiatric/Behavioral: Negative for depression.    Dilation: 6 Effacement (%): 80 Station: -1 Exam by:: K.Wilson,RN Blood pressure 113/68, pulse 74, temperature 98 F (36.7 C), temperature  source Oral, resp. rate 18, height 5\' 1"  (1.549 m), weight 138 lb 3.2 oz (62.687 kg). Maternal Exam:  Uterine Assessment: Contraction strength is firm.  Contraction duration is 60 seconds. Contraction frequency is regular.   Abdomen: Fundal height is not done.   Estimated fetal weight is EFW 6-6 1/2#.    Introitus: Normal vulva. Normal vagina.  Vagina is negative for discharge.  Ferning test: not done.  Nitrazine test: not done. Amniotic fluid character: not assessed.  Cervix: Cervix evaluated by digital exam.     Fetal Exam Fetal Monitor Review: Mode: ultrasound.   Baseline rate: 140.  Variability: moderate (6-25 bpm).   Pattern: accelerations present and early decelerations.    Fetal State Assessment: Category I - tracings are normal.     Physical Exam  Constitutional: She is oriented to person, place, and time. She appears well-developed and well-nourished. She appears distressed.  HENT:  Head: Normocephalic.  Cardiovascular: Normal rate and normal heart sounds.   Respiratory: Effort normal and breath sounds normal.  GI: Soft. There is no tenderness.  Genitourinary: No vaginal discharge found.  Musculoskeletal: Normal range of motion. She exhibits no edema.  Neurological: She is alert and oriented to person, place, and time. She has normal reflexes.  Psychiatric: She has a normal mood and affect. Her behavior is normal. Thought content normal.   Dilation: 6 Effacement (%): 80 Station: -1 Presentation: Vertex Exam by:: K.Wilson,RN Prenatal labs: ABO, Rh: O/POS/-- (11/20 1127) Antibody: NEG (11/20 1127) Rubella: 56.3 (11/20 1127) RPR: NON REAC (11/20 1127)  HBsAg: NEGATIVE (11/20 1127)  HIV: NON REACTIVE (11/20 1127)  GBS:   unknown 1 hr: 141  Assessment/Plan: D/W Dr. Debroah Loop re discrepant dating: unknown LMP, initial [place] c/w [redacted]w[redacted]d ([redacted]w[redacted]d by F/U US)> will not prophylax for GBS G3P2002 in active labor with reassuring FHR > epidural on request Insufficient  PNC Abnormal glucose screen> POCT glucose pending Expectant management  Fordyce Lepak 05/13/2012, 10:10 AM

## 2012-05-13 NOTE — MAU Note (Signed)
Pt reports contracts since yesterday. Denies SROM or bleeding

## 2012-05-13 NOTE — Anesthesia Preprocedure Evaluation (Signed)

## 2012-05-13 NOTE — Progress Notes (Signed)
Pt states "she is too tired to push."  D Poe CNM at bedside.  Will labor down.

## 2012-05-13 NOTE — Anesthesia Procedure Notes (Signed)

## 2012-05-14 NOTE — Anesthesia Postprocedure Evaluation (Signed)
  Anesthesia Post-op Note  Patient: Jamie Li  Procedure(s) Performed: * No procedures listed *  Patient Location: Mother/Baby  Anesthesia Type:Epidural  Level of Consciousness: awake, alert  and oriented  Airway and Oxygen Therapy: Patient Spontanous Breathing  Post-op Pain: mild  Post-op Assessment: Patient's Cardiovascular Status Stable, Respiratory Function Stable, No headache, No backache, No residual numbness and No residual motor weakness  Post-op Vital Signs: stable  Complications: No apparent anesthesia complications

## 2012-05-14 NOTE — Progress Notes (Signed)
I saw and examined patient and agree with above. Late/poor prenatal care. Social work to see patient. Concerns by pediatrics team about baby going home yet due to feeding issues. Will stay until tomorrow. Napoleon Form, MD

## 2012-05-14 NOTE — Progress Notes (Signed)
Post Partum Day 1 Subjective: no complaints, voiding, tolerating PO and + flatus  Objective: Blood pressure 92/55, pulse 71, temperature 98.2 F (36.8 C), temperature source Oral, resp. rate 16, height 5\' 1"  (1.549 m), weight 62.687 kg (138 lb 3.2 oz), SpO2 97.00%, unknown if currently breastfeeding.  Physical Exam:  General: alert, cooperative and no distress Lochia: appropriate Uterine Fundus: firm DVT Evaluation: No evidence of DVT seen on physical exam.   Basename 05/13/12 0901  HGB 12.2  HCT 36.2    Assessment/Plan: Plan to discharge home tomorrow Breastfeeding GBS unknown, not treating Contraception: Nexplanon   LOS: 1 day   Corky Downs 05/14/2012, 7:34 AM

## 2012-05-15 ENCOUNTER — Encounter: Payer: Self-pay | Admitting: Obstetrics and Gynecology

## 2012-05-15 MED ORDER — OXYCODONE-ACETAMINOPHEN 5-325 MG PO TABS: 1.0000 | ORAL_TABLET | ORAL | Status: DC | PRN

## 2012-05-15 MED ORDER — IBUPROFEN 600 MG PO TABS: 600.0000 mg | ORAL_TABLET | Freq: Four times a day (QID) | ORAL | Status: DC

## 2012-05-15 NOTE — Discharge Summary (Signed)
Obstetric Discharge Summary  Jamie Li is a 26 y.o. G3P3003 who presented in active labor at [redacted]w[redacted]d gestation. She had minimal prenatal care. She went on to have a normal spontaneous vaginal delivery with normal postpartum course.  Reason for Admission: onset of labor Prenatal Procedures: none and late to establish care, abnormal 1h OGTT with no f/u Intrapartum Procedures: spontaneous vaginal delivery Postpartum Procedures: none Complications-Operative and Postpartum: GBS unknown, not treated Hemoglobin  Date Value Range Status  05/13/2012 12.2  12.0 - 15.0 g/dL Final     HCT  Date Value Range Status  05/13/2012 36.2  36.0 - 46.0 % Final    Physical Exam:  General: alert, cooperative and no distress Lochia: appropriate Uterine Fundus: firm DVT Evaluation: No evidence of DVT seen on physical exam.  Discharge Diagnoses: Premature labor  Discharge Information: Date: 05/15/2012 Activity: unrestricted Diet: routine Medications: Ibuprofen and Colace Condition: stable Instructions: refer to practice specific booklet Discharge to: home Follow-up Information    Schedule an appointment as soon as possible for a visit with [facility]. (4-6 weeks for routine postpertum follow up)    Contact information:   [address] [place] [address] [phone]         Newborn Data: Live born female  Birth Weight: 7 lb 6.9 oz (3370 g) APGAR: 8, 9  Home with mother. Breast and bottle feeding. Wants Nexplanon at f/u appt.   Corky Downs 05/15/2012, 8:03 AM  I have seen and examined patient and agree with above student note.  With Spanish language translator, I discussed birth control options with the patient and the costs of Nexplanon vs Mirena IUD. I explained to the patient that there is a financial assistance program to cover the costs of the Mirena, which would save her $1200 and she would only need to pay the insertion fee.  She was advised  to follow up in clinic in 4 weeks for post-partum visit to make a decision regarding which method she wants. Information was provided in written form at discharge.   Napoleon Form, MD

## 2012-05-15 NOTE — Clinical Social Work Maternal (Signed)
    Clinical Social Work Department PSYCHOSOCIAL ASSESSMENT - MATERNAL/CHILD 05/15/2012  Patient:  NABILA, ALBARRACIN  Account Number:  [id number]  Admit Date:  05/13/2012  Marjo Bicker Name:   BG Primitivo Gauze    Clinical Social Worker:  Nobie Putnam, LCSW   Date/Time:  05/15/2012 02:44 PM  Date Referred:  05/15/2012   Referral source  CN     Referred reason  [facility]   Other referral source:    I:  FAMILY / HOME ENVIRONMENT Child's legal guardian:  PARENT  Guardian - Name Guardian - Age Guardian - Address  Taiwana Willison [address]; [place], [place] [address]  Lorayne Marek 29 (same as above)   Other household support members/support persons Name Relationship DOB   DAUGHTER 16 years old   DAUGHTER 68 years old   Other support:    II  PSYCHOSOCIAL DATA Information Source:  Patient Interview  Event organiser Employment:   Surveyor, quantity resources:  [employer] If Medicaid - County:  [place] Other  WIC   School / Grade:   Maternity Care Coordinator / Child Services Coordination / Early Interventions:  Cultural issues impacting care:    III  STRENGTHS Strengths  Adequate Resources  Home prepared for Child (including basic supplies)  Supportive family/friends   Strength comment:    IV  RISK FACTORS AND CURRENT PROBLEMS Current Problem:  YES   Risk Factor & Current Problem Patient Issue Family Issue Risk Factor / Current Problem Comment  Other - See comment Y N LPNC @ 26 weeks    V  SOCIAL WORK ASSESSMENT CSW referral received to assess reason for [facility] @ 35 weeks. Pt told CSW that she tried to seek [facility] at [facility] but referred to [facility], then finally referred to the clinic.  She denies any illegal substance use and verbalized understanding of hospital drug testing policy. UDS is negative, meconium results are pending.  She has all the necessary supplies for the infant and good support. FOB at bedside and appears supportive.  CSW will  continue to monitor drug screen results and make a referral if needed.      VI SOCIAL WORK PLAN Social Work Plan  No Further Intervention Required / No Barriers to Discharge   Type of pt/family education:   If child protective services report - county:   If child protective services report - date:   Information/referral to community resources comment:   Other social work plan:

## 2012-05-15 NOTE — Progress Notes (Signed)
Ur chart review completed.  

## 2012-05-16 ENCOUNTER — Ambulatory Visit (HOSPITAL_COMMUNITY): Payer: Self-pay

## 2012-05-16 ENCOUNTER — Encounter: Payer: Self-pay | Admitting: Obstetrics & Gynecology

## 2012-06-13 ENCOUNTER — Ambulatory Visit: Payer: Self-pay | Admitting: Family

## 2012-07-19 ENCOUNTER — Encounter: Payer: Self-pay | Admitting: Family Medicine

## 2012-07-19 ENCOUNTER — Ambulatory Visit (INDEPENDENT_AMBULATORY_CARE_PROVIDER_SITE_OTHER): Payer: Self-pay | Admitting: Family Medicine

## 2012-07-19 VITALS — BP 106/70 | HR 58 | Ht 61.0 in | Wt 118.3 lb

## 2012-07-19 DIAGNOSIS — M542 Cervicalgia: Secondary | ICD-10-CM | POA: Insufficient documentation

## 2012-07-19 DIAGNOSIS — Z3009 Encounter for other general counseling and advice on contraception: Secondary | ICD-10-CM

## 2012-07-19 DIAGNOSIS — R5381 Other malaise: Secondary | ICD-10-CM

## 2012-07-19 MED ORDER — FLUOXETINE HCL 20 MG PO TABS: 20.0000 mg | ORAL_TABLET | Freq: Every day | ORAL | Status: DC

## 2012-07-19 NOTE — Patient Instructions (Addendum)
Fue un Research officer, trade union; estoy iniciando un tratamiento que creo que le ayudara' con el dolor de Audiological scientist, el Buckeye, y los Ingalls de humor. Tome la fluoxetine 20mg , una tableta cada manana con el desayuno.   ES IMPORTANTE QUE ME LLAME AL [phone] PARA ACTUALIZARME SOBRE SU PROGRESO CON LA MEDICINA.  Conley Rolls [facility]' AL [phone].  Voy a Education officer, community Depo Provera para ver si lo recibe hoy.

## 2012-07-19 NOTE — Assessment & Plan Note (Signed)
Cervicalgia, generalized low energy, low libido, and anxiety with some somatic complaints (HA and neck pain, abd pain) which I believe are constellation of sxs related to underlying depressive disorder.  Patient is very concerned about cost of her care, as she is just beginning the process of applying for orange card.  She and I agree with the idea of low-dose SSRI to start, and follow up in 2 weeks.  Because of cost concerns on behalf of the patient, I have offered her the option of a phone consultation in 2 weeks, I ask that she call our office and I will call her back (cell phone in AVS) to discuss her progress.  She understands that if she is not improving or if getting worse, it may be necessary to see her back in the office at that time but we will determine at time of our phone consult.

## 2012-07-19 NOTE — Progress Notes (Signed)
  Subjective:    Patient ID: Jamie Li, female    DOB: 1985-11-20, 27 y.o.   MRN: [id number]  HPI Visit conducted in Spanish.  Jamie Li comes in for new patient visit; she is accompanied by her daughters Jamie Li and her newborn daughter born on May [birth year], 2012).  Patient offers several complaints:  1. Nuchal pain that is not associated with movement of neck, comes and goes.  Seems contiguous with bitemporal pain that she also has off and on, and which she attributes to "stress".  2. Headache (bitemporal), not associated with photophobia or vomiting, comes and goes as well.  Has had more in recent days.   3. Has nausea and repulsion when presented with food, but at times will feel hunger and repugnance at the same time.  Some periumbilical pain that is sharp and fleeting, does not last more than a few moments, not associated with meals or position.  Is having normal formed stools, no diarrhea or constipation and no blood per rectum.  4. She admits to having some emotional lability since giving birth on Dec 2nd, 2013, but says her anxiety and depressive symptoms preceded her pregnancy.  Feels overwhelmed at times. May smoke a single cigarette per week as a stress relief, but does not drink.  Husband is supportive, however she has no other family or friends here and feels very isolated.  Her family is in [place].     Review of Systems Denies SI; tearful at times, worse since right after delivery.  Anhedonia and some decreased libido.  LMP Jan 7-12, is anticipating next one any day.  Would like depo-provera but concerned about cost (had used it before and agreed with her).  Has resumed sexual intercourse with husband.  Is breast feeding. No surgical history.      Objective:   Physical Exam Well appearing, no apparent distress HEENT Neck supple, full active and passive ROM.  No tenderness over trapezius mm. No point tenderness over cervical vertebral processes. PERRL. TMs clear. No thyromegaly or  nodules.  COR regular s1s2, no extra sounds PULM Clear bilaterally, no rales or wheezes ABD Soft, nontender, nondistended.  Audible bowel sounds. No masses, negative Murphys sign.         Assessment & Plan:

## 2012-12-27 IMAGING — US US OB COMP +14 WK
2 series · 12 of 28 positions shown · non-contrast
Comparison: none

[Series 1: us ob comp +14 wk · 6 acquisitions, 1 frame shown (1 of 2)]
[im 6/6]
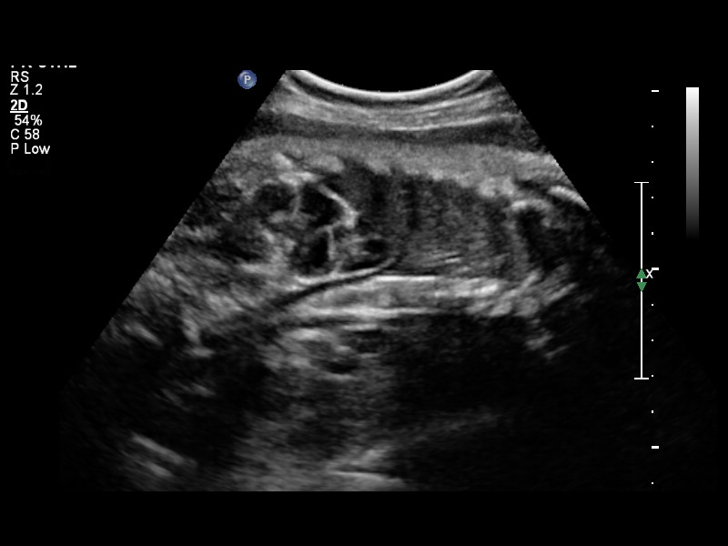

[Series 1: us ob comp +14 wk · 11 of 90 slices shown (2 of 2)]
[im 4/90]
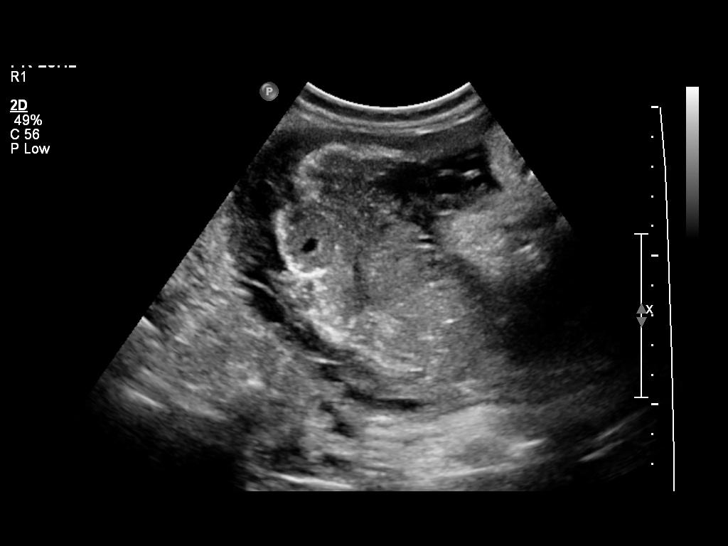
[im 11/90]
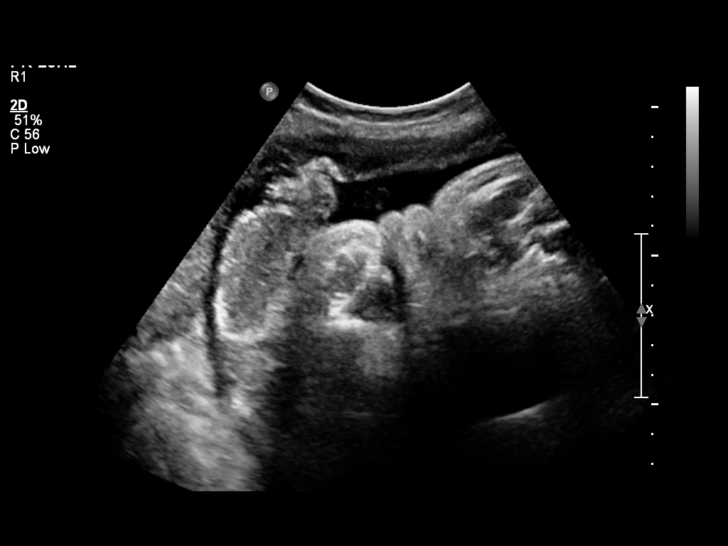
[im 22/90]
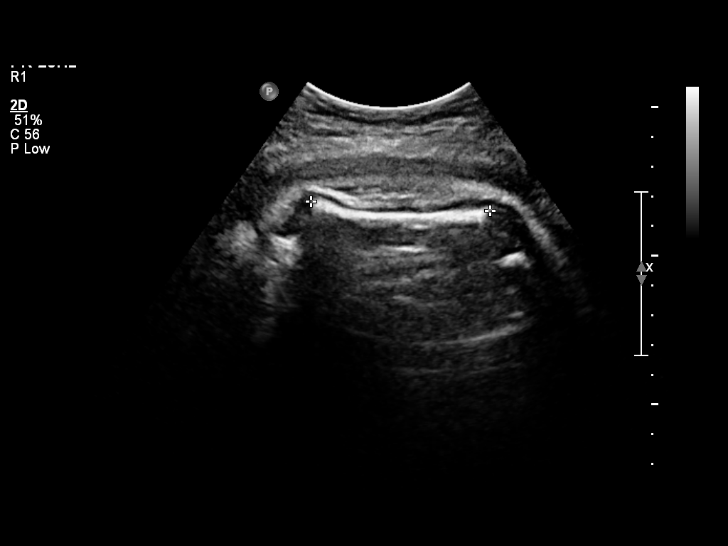
[im 29/90]
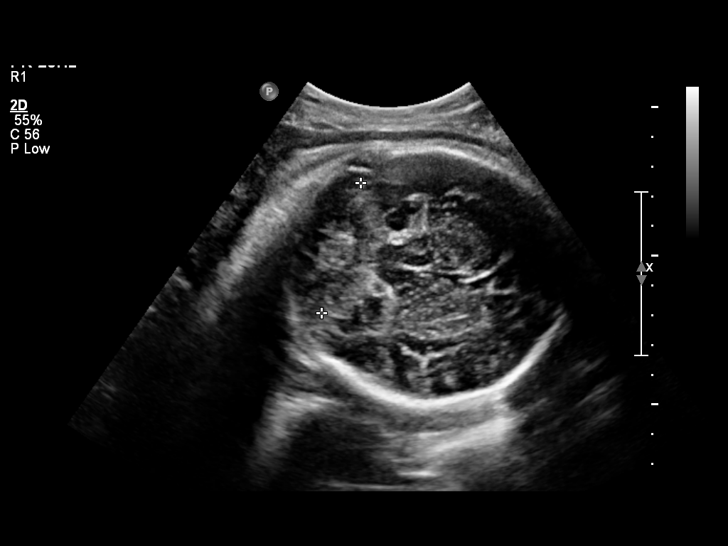
[im 36/90]
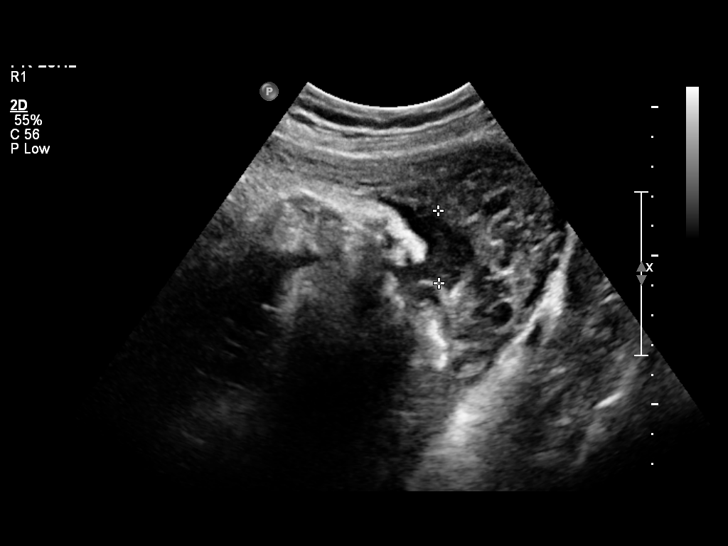
[im 47/90]
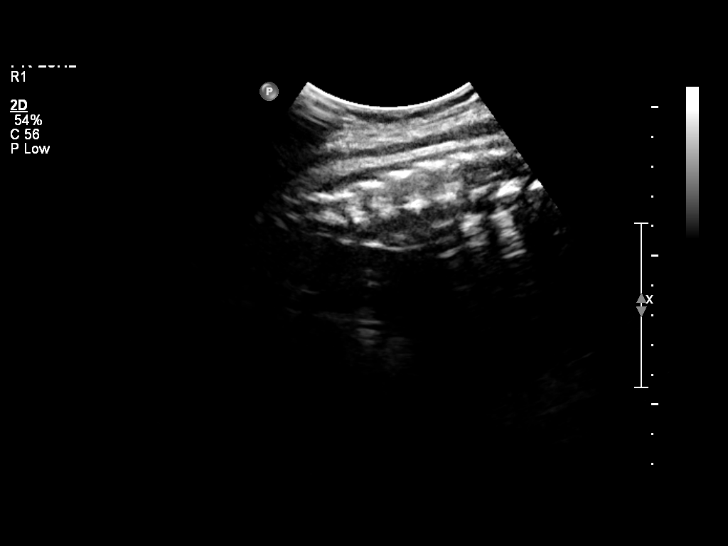
[im 54/90]
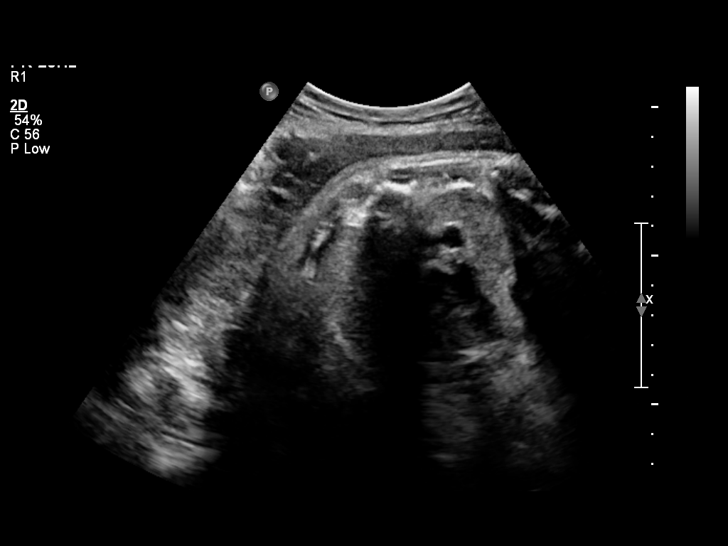
[im 61/90]
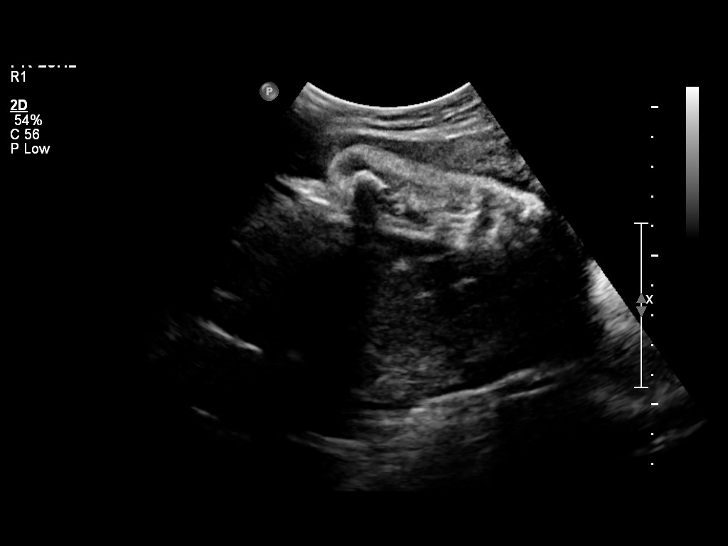
[im 72/90]
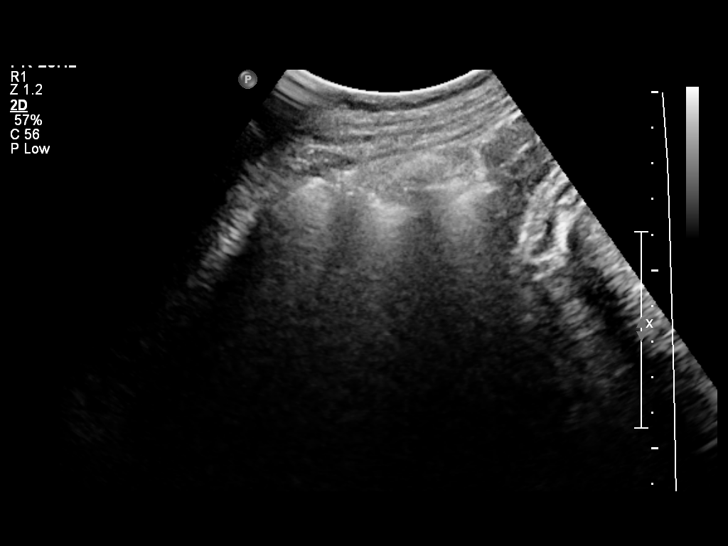
[im 79/90]
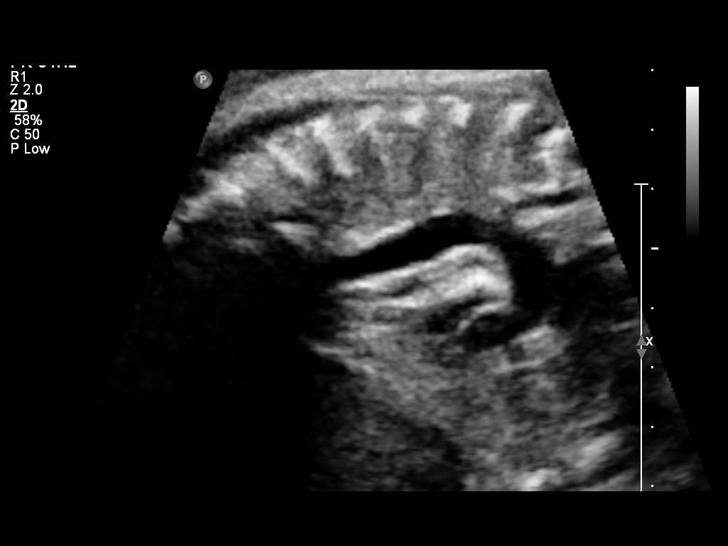
[im 86/90]
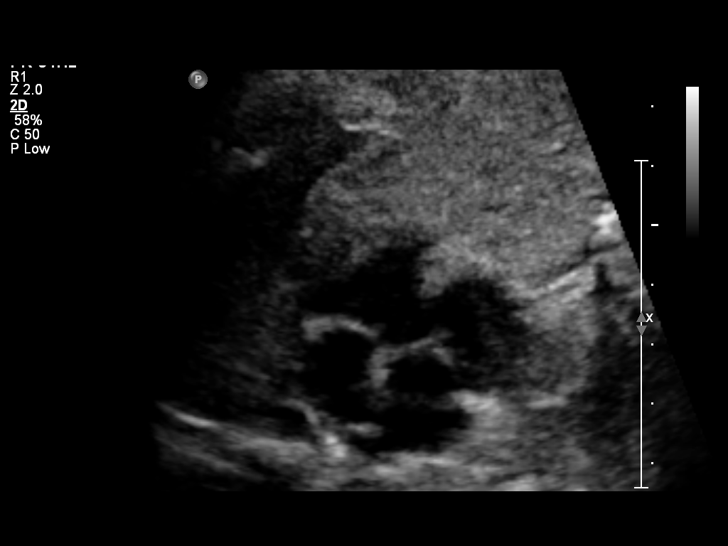

[12 of 28 positions shown; findings below may reference images not displayed]

OBSTETRICS REPORT
                      (Signed Final 05/02/2012 [DATE])

Service(s) Provided

 US OB COMP + 14 WK                                    76805.1
 US UA CORD DOPPLER                                    76820.0
Indications

 Uncertain LMP;  Establish Gestational [AGE]
 No prenatal care
 Oligohydramnios / Decreased amniotic fluid volume
Fetal Evaluation

 Num Of Fetuses:    1
 Fetal Heart Rate:  140                         bpm
 Cardiac Activity:  Observed
 Presentation:      Cephalic
 Placenta:          Left lateral post, above
                    cervical os

 Amniotic Fluid
 AFI FV:      Subjectively low-normal
 AFI Sum:     7.17    cm        4  %Tile     Larg Pckt:   4.74   cm
 LUQ:   4.74   cm    LLQ:    2.43   cm
Biophysical Evaluation

 Amniotic F.V:   Within normal limits       F. Tone:        Observed
 F. Movement:    Observed                   Score:          [DATE]
 F. Breathing:   Observed
Biometry

 BPD:     84.8  mm    G. Age:   34w 1d                CI:        71.98   70 - 86
                                                      FL/HC:      21.8   20.1 -

 HC:     318.1  mm    G. Age:   35w 6d       24  %    HC/AC:      0.97   0.93 -

 AC:     327.3  mm    G. Age:   36w 4d       84  %    FL/BPD:     82.0   71 - 87
 FL:      69.5  mm    G. Age:   35w 5d       47  %    FL/AC:      21.2   20 - 24
 HUM:     59.9  mm    G. Age:   34w 6d       48  %
 CER:     48.7  mm    G. Age:   N/A        > 95  %

 Est. FW:    1818  gm      6 lb 4 oz     73  %
Gestational Age

 U/S Today:     35w 4d                                        EDD:   06/02/12
 Best:          35w 4d    Det. By:   U/S (05/02/12)           EDD:   06/02/12
Anatomy

 Cranium:          Appears normal         Aortic Arch:      Appears normal
 Fetal Cavum:      Appears normal         Ductal Arch:      Appears normal
 Ventricles:       Appears normal         Diaphragm:        Appears normal
 Choroid Plexus:   Appears normal         Stomach:          Appears normal
 Cerebellum:       Appears normal         Abdomen:          Appears normal
 Posterior Fossa:  Appears normal         Abdominal Wall:   Not well visualized
 Nuchal Fold:      Appears normal         Cord Vessels:     Appears normal (3
                                                            vessel cord)
 Face:             Appears normal         Kidneys:          Appear normal
                   (orbits and profile)
 Lips:             Appears normal         Bladder:          Appears normal
 Heart:            Appears normal         Spine:            Appears normal
                   (4CH, axis, and
                   situs)
 RVOT:             Appears normal         Lower             Appears normal
                                          Extremities:
 LVOT:             Appears normal         Upper             Appears normal
                                          Extremities:

 Other:  Female gender. Technically difficult due to fetal position. Technically
         difficult due to advanced GA.
Doppler - Fetal Vessels

 Umbilical Artery
 S/D:   3.1            82  %tile
 Umbilical Artery
 Absent DFV:    No     Reverse DFV:    No

Cervix Uterus Adnexa

 Cervix:       Not visualized (advanced GA >34 wks)
 Left Ovary:   Not visualized.
 Right Ovary:  Not visualized.
Comments

 Because of the low amniotic fluid volume, this report was
 called to Dr. Plop and BPP and U/A Doppler was
 performed as requested.
Impression

 Single intrauterine gestation demonstrating an estimated
 gestational age by ultrasound of 35w 4d. Fetal parameters
 correlate with this composite EGA.  EFW is currently at the
 73%.

 Visualized fetal anatomy appears normal with overall
 assessment somewhat compromised by advanced
 gestational age.

 Subjectively and quantitatively decreased amniotic fluid
 volume with an AFI at the 4%.

 U/A S/D ratio at the 82% for EGA with normal waveforms
 noted.
 BPP [DATE].

## 2013-06-12 NOTE — L&D Delivery Note (Signed)
Delivery Note Called by MAU for patient with contractions, measured as complete. Accompanied patient to L&D. Mother pushed over intact perineum. At 10:55 PM a viable female was delivered via Vaginal, Spontaneous Delivery (Presentation: Left Occiput Anterior).  APGAR: 8, 9; weight pending.  Cord clamped x 2 and cut. Traction applied to cord and placenta delivered, Pitocin given IM once placenta delivered. Placenta status: Intact, Spontaneous.  Cord: 3 vessels with the following complications: None.  Cord pH: n/a.  Interpreter present throughout delivery. Counts correct. Patient hemostatic.  Anesthesia: None  Episiotomy: None Lacerations: None Suture Repair: none Est. Blood Loss (mL): 250  Mom to postpartum.  Baby to Couplet care / Skin to Skin. Patient has had no prenatal care. Continue to monitor closely.   Sunnie Nielsenlexander, Natalie 11/30/2013, 11:31 PM

## 2013-06-12 NOTE — L&D Delivery Note (Signed)
Agree with above assessment

## 2013-11-30 ENCOUNTER — Inpatient Hospital Stay (HOSPITAL_COMMUNITY)
Admission: AD | Admit: 2013-11-30 | Discharge: 2013-12-02 | DRG: 775 | Disposition: A | Discharge status: Home / Self Care | Payer: Medicaid Other | Source: Ambulatory Visit | Attending: Obstetrics & Gynecology | Admitting: Obstetrics & Gynecology

## 2013-11-30 ENCOUNTER — Encounter (HOSPITAL_COMMUNITY): Payer: Self-pay | Admitting: *Deleted

## 2013-11-30 DIAGNOSIS — O479 False labor, unspecified: Secondary | ICD-10-CM | POA: Diagnosis present

## 2013-11-30 DIAGNOSIS — O0930 Supervision of pregnancy with insufficient antenatal care, unspecified trimester: Secondary | ICD-10-CM | POA: Diagnosis not present

## 2013-11-30 DIAGNOSIS — O99334 Smoking (tobacco) complicating childbirth: Secondary | ICD-10-CM | POA: Diagnosis present

## 2013-11-30 DIAGNOSIS — Z3490 Encounter for supervision of normal pregnancy, unspecified, unspecified trimester: Secondary | ICD-10-CM

## 2013-11-30 LAB — CBC
HEMATOCRIT: 34.6 % — AB (ref 36.0–46.0)
Hemoglobin: 11.5 g/dL — ABNORMAL LOW (ref 12.0–15.0)
MCH: 29.1 pg (ref 26.0–34.0)
MCHC: 33.2 g/dL (ref 30.0–36.0)
MCV: 87.6 fL (ref 78.0–100.0)
Platelets: 190 10*3/uL (ref 150–400)
RBC: 3.95 MIL/uL (ref 3.87–5.11)
RDW: 15.2 % (ref 11.5–15.5)
WBC: 9.8 10*3/uL (ref 4.0–10.5)

## 2013-11-30 MED ORDER — CITRIC ACID-SODIUM CITRATE 334-500 MG/5ML PO SOLN: 30.0000 mL | ORAL | Status: DC | PRN

## 2013-11-30 MED ORDER — ONDANSETRON HCL 4 MG/2ML IJ SOLN: 4.0000 mg | Freq: Four times a day (QID) | INTRAMUSCULAR | Status: DC | PRN

## 2013-11-30 MED ORDER — OXYTOCIN 10 UNIT/ML IJ SOLN
INTRAMUSCULAR | Status: AC
  Administered 2013-11-30: 10 [IU]
  Filled 2013-11-30: qty 1

## 2013-11-30 MED ORDER — IBUPROFEN 600 MG PO TABS
600.0000 mg | ORAL_TABLET | Freq: Four times a day (QID) | ORAL | Status: DC | PRN
  Administered 2013-11-30: 600 mg via ORAL
  Filled 2013-11-30: qty 1

## 2013-11-30 MED ORDER — LACTATED RINGERS IV SOLN: INTRAVENOUS | Status: DC

## 2013-11-30 MED ORDER — ACETAMINOPHEN 325 MG PO TABS: 650.0000 mg | ORAL_TABLET | ORAL | Status: DC | PRN

## 2013-11-30 MED ORDER — LACTATED RINGERS IV SOLN: 500.0000 mL | INTRAVENOUS | Status: DC | PRN

## 2013-11-30 MED ORDER — LIDOCAINE HCL (PF) 1 % IJ SOLN
30.0000 mL | INTRAMUSCULAR | Status: DC | PRN
  Filled 2013-11-30: qty 30

## 2013-11-30 MED ORDER — OXYTOCIN 40 UNITS IN LACTATED RINGERS INFUSION - SIMPLE MED: 62.5000 mL/h | INTRAVENOUS | Status: DC

## 2013-11-30 MED ORDER — OXYCODONE-ACETAMINOPHEN 5-325 MG PO TABS: 1.0000 | ORAL_TABLET | ORAL | Status: DC | PRN

## 2013-11-30 MED ORDER — LIDOCAINE HCL (PF) 1 % IJ SOLN
INTRAMUSCULAR | Status: AC
  Filled 2013-11-30: qty 30

## 2013-11-30 MED ORDER — OXYTOCIN BOLUS FROM INFUSION: 500.0000 mL | INTRAVENOUS | Status: DC

## 2013-11-30 MED ORDER — FLEET ENEMA 7-19 GM/118ML RE ENEM: 1.0000 | ENEMA | RECTAL | Status: DC | PRN

## 2013-11-30 NOTE — H&P (Signed)
Jamie Li is a 28 y.o. female presenting for labor with impending delivery, G4P2003. No prenatal care.. Maternal Medical History:  Reason for admission: Contractions.   Contractions: Onset was 6-12 hours ago.   Frequency: regular.   Perceived severity is strong.    Fetal activity: Perceived fetal activity is normal.   Last perceived fetal movement was within the past hour.      OB History   Grav Para Term Preterm Abortions TAB SAB Ect Mult Living   4 3 3       3      History reviewed. No pertinent past medical history. History reviewed. No pertinent past surgical history. Family History: family history is not on file. Social History:  reports that she has been smoking.  She has never used smokeless tobacco. She reports that she does not drink alcohol or use illicit drugs.   Prenatal Transfer Tool  Maternal Diabetes: No Genetic Screening: Declined Maternal Ultrasounds/Referrals: Declined Fetal Ultrasounds or other Referrals:  Other:  Maternal Substance Abuse:  No Significant Maternal Medications:  None Significant Maternal Lab Results:  None Other Comments:  no prenatal care, impending delivery.  Review of Systems  Constitutional: Negative.   HENT: Negative.   Eyes: Negative.   Respiratory: Negative.   Cardiovascular: Negative.   Gastrointestinal: Positive for abdominal pain.  Genitourinary: Negative.   Musculoskeletal: Negative.   Skin: Negative.   Neurological: Negative.   Endo/Heme/Allergies: Negative.   Psychiatric/Behavioral: Negative.     Dilation: 10 Effacement (%): 100 Station: +2 Exam by:: D. Lawson, cnm Blood pressure 129/61, pulse 78, temperature 97.8 F (36.6 C), temperature source Oral, resp. rate 20, currently breastfeeding. Maternal Exam:  Uterine Assessment: Contraction strength is firm.  Contraction frequency is regular.   Abdomen: Patient reports no abdominal tenderness. Fetal presentation: vertex  Introitus: Normal vulva. Normal  vagina.    Fetal Exam Fetal Monitor Review: Mode: ultrasound.   Variability: moderate (6-25 bpm).   Pattern: accelerations present.    Fetal State Assessment: Category I - tracings are normal.     Physical Exam  Constitutional: She is oriented to person, place, and time. She appears well-developed and well-nourished.  HENT:  Head: Normocephalic.  Cardiovascular: Normal rate, regular rhythm, normal heart sounds and intact distal pulses.   Respiratory: Effort normal and breath sounds normal.  GI: Soft. Bowel sounds are normal.  Genitourinary: Vagina normal and uterus normal.  Musculoskeletal: Normal range of motion.  Neurological: She is alert and oriented to person, place, and time. She has normal reflexes.  Skin: Skin is warm and dry.  Psychiatric: She has a normal mood and affect. Her behavior is normal. Judgment and thought content normal.    Prenatal labs: ABO, Rh:   Antibody:   Rubella:   RPR:    HBsAg:    HIV:    GBS:     Assessment/Plan: Impending delivery.   Wyvonnia DuskyLAWSON, MARIE DARLENE 11/30/2013, 11:13 PM

## 2013-12-01 LAB — TYPE AND SCREEN
ABO/RH(D): O POS
ANTIBODY SCREEN: NEGATIVE

## 2013-12-01 LAB — RPR

## 2013-12-01 MED ORDER — ZOLPIDEM TARTRATE 5 MG PO TABS: 5.0000 mg | ORAL_TABLET | Freq: Every evening | ORAL | Status: DC | PRN

## 2013-12-01 MED ORDER — IBUPROFEN 600 MG PO TABS
600.0000 mg | ORAL_TABLET | Freq: Four times a day (QID) | ORAL | Status: DC
  Administered 2013-12-01 – 2013-12-02 (×6): 600 mg via ORAL
  Filled 2013-12-01 (×6): qty 1

## 2013-12-01 MED ORDER — OXYCODONE-ACETAMINOPHEN 5-325 MG PO TABS
1.0000 | ORAL_TABLET | ORAL | Status: DC | PRN
  Administered 2013-12-01 – 2013-12-02 (×5): 1 via ORAL
  Filled 2013-12-01 (×5): qty 1

## 2013-12-01 MED ORDER — BENZOCAINE-MENTHOL 20-0.5 % EX AERO: 1.0000 "application " | INHALATION_SPRAY | CUTANEOUS | Status: DC | PRN

## 2013-12-01 MED ORDER — PRENATAL MULTIVITAMIN CH
1.0000 | ORAL_TABLET | Freq: Every day | ORAL | Status: DC
  Administered 2013-12-01 – 2013-12-02 (×2): 1 via ORAL
  Filled 2013-12-01 (×2): qty 1

## 2013-12-01 MED ORDER — ONDANSETRON HCL 4 MG/2ML IJ SOLN: 4.0000 mg | INTRAMUSCULAR | Status: DC | PRN

## 2013-12-01 MED ORDER — ONDANSETRON HCL 4 MG PO TABS: 4.0000 mg | ORAL_TABLET | ORAL | Status: DC | PRN

## 2013-12-01 MED ORDER — WITCH HAZEL-GLYCERIN EX PADS: 1.0000 "application " | MEDICATED_PAD | CUTANEOUS | Status: DC | PRN

## 2013-12-01 MED ORDER — LANOLIN HYDROUS EX OINT: TOPICAL_OINTMENT | CUTANEOUS | Status: DC | PRN

## 2013-12-01 MED ORDER — TETANUS-DIPHTH-ACELL PERTUSSIS 5-2.5-18.5 LF-MCG/0.5 IM SUSP: 0.5000 mL | Freq: Once | INTRAMUSCULAR | Status: DC

## 2013-12-01 MED ORDER — DIBUCAINE 1 % RE OINT: 1.0000 "application " | TOPICAL_OINTMENT | RECTAL | Status: DC | PRN

## 2013-12-01 MED ORDER — SENNOSIDES-DOCUSATE SODIUM 8.6-50 MG PO TABS
2.0000 | ORAL_TABLET | ORAL | Status: DC
  Administered 2013-12-01: 2 via ORAL
  Filled 2013-12-01: qty 2

## 2013-12-01 MED ORDER — DIPHENHYDRAMINE HCL 25 MG PO CAPS: 25.0000 mg | ORAL_CAPSULE | Freq: Four times a day (QID) | ORAL | Status: DC | PRN

## 2013-12-01 MED ORDER — SIMETHICONE 80 MG PO CHEW: 80.0000 mg | CHEWABLE_TABLET | ORAL | Status: DC | PRN

## 2013-12-01 NOTE — Progress Notes (Signed)
Post Partum Day 1 Subjective: up ad lib, voiding and tolerating PO  Objective: Blood pressure 105/70, pulse 73, temperature 98.7 F (37.1 C), temperature source Oral, resp. rate 18, height 5\' 3"  (1.6 m), weight 63.504 kg (140 lb), unknown if currently breastfeeding.  Physical Exam:  General: alert, cooperative and no distress Lochia: appropriate Uterine Fundus: firm Incision: n/a DVT Evaluation: No evidence of DVT seen on physical exam.   Recent Labs  11/30/13 2315  HGB 11.5*  HCT 34.6*    Assessment/Plan: Plan for discharge tomorrow Breast/Bottle feeding  Unsure of contraception.  Thinking about depo  Denies circ for baby    LOS: 1 day   Myra RudeSchmitz, Jeremy E 12/01/2013, 7:46 AM   Seen also by me Agree with note Aviva SignsMarie L Williams, CNM

## 2013-12-01 NOTE — Progress Notes (Signed)
Ur chart review completed.  

## 2013-12-01 NOTE — Progress Notes (Signed)
CSW received consult for "No prenatal care due to financial cost/no insurance."  CSW spoke with R. South/[facility] Financial Counselor who state she has already spoken with patient.  UDS is negative.  CSW will monitor MDS result.  

## 2013-12-01 NOTE — Progress Notes (Signed)
Post Partum Day 1 Subjective: no complaints, up ad lib, voiding, tolerating PO and + flatus  Objective: Blood pressure 105/70, pulse 73, temperature 98.7 F (37.1 C), temperature source Oral, resp. rate 18, height 5\' 3"  (1.6 m), weight 140 lb (63.504 kg), unknown if currently breastfeeding.  Physical Exam:  General: alert, cooperative, appears stated age and no distress Lochia: appropriate Uterine Fundus: firm Incision: n/a DVT Evaluation: No evidence of DVT seen on physical exam. Negative Homan's sign. No cords or calf tenderness.   Recent Labs  11/30/13 2315  HGB 11.5*  HCT 34.6*    Assessment/Plan: Plan for discharge tomorrow   LOS: 1 day   Afton Mikelson DARLENE 12/01/2013, 8:02 AM

## 2013-12-01 NOTE — Lactation Note (Signed)
This note was copied from the chart of Boy Primitivo GauzeYesenia Ponce-Sosa. Lactation Consultation Note Follow up visit at  18 hours of age.  Baby had breastfeeding about 1 hours ago for 10 minutes.  Encouraged mom to keep baby awake and active for feedings.  Baby has already had 7 feedings with 3 voids and 2 stools.  Previous latch scores of "8"  Mom denies pain or concerns.  Demonstrated hand expression with colostrum visible and showed mom she has milk, encouraged to rub EBM into nipples.  Mom to call for assist as needed.    Patient Name: Boy Primitivo GauzeYesenia Ponce-Sosa ZOXWR'UToday's Date: 12/01/2013 Reason for consult: Follow-up assessment   Maternal Data Has patient been taught Hand Expression?: Yes  Feeding Feeding Type: Breast Fed Length of feed: 10 min  LATCH Score/Interventions Latch: Grasps breast easily, tongue down, lips flanged, rhythmical sucking.  Audible Swallowing: A few with stimulation  Type of Nipple: Everted at rest and after stimulation  Comfort (Breast/Nipple): Soft / non-tender     Hold (Positioning): Assistance needed to correctly position infant at breast and maintain latch. Intervention(s): Breastfeeding basics reviewed  LATCH Score: 8  Lactation Tools Discussed/Used     Consult Status Consult Status: Follow-up Date: 12/02/13 Follow-up type: In-patient    Shoptaw, Arvella MerlesJana Lynn 12/01/2013, 5:20 PM

## 2013-12-01 NOTE — Lactation Note (Signed)
This note was copied from the chart of Boy Primitivo GauzeYesenia Ponce-Sosa. Lactation Consultation Note Mom speaks Spanish, stated she know a little bit of English. Denied the need of interpreter at this time. Asked about BF experience. BF first baby for 2 yrs, and she has a 1 1/2 yr. Old and BF for only 8 months. Didn't have any trouble BF any of her children. Did note mom BF w/baby laying in lap BF w/mom hunched down baby suckling on Breast. Encouraged mom to be comfortable and bring the baby to her and not to be bending down to the baby. Encouraged pillows for support. Has good breast anatomy, baby latching well. Mom encouraged to feed baby 8-12 times/24 hours and with feeding cues. Mom encouraged to feed baby w/feeding cuesMom reports + breast changes w/pregnancy. WH/LC brochure given w/resources, support groups and LC services.Encouraged comfort during BF so colostrum flows better and mom will enjoy the feeding longer. Taking deep breaths and breast massage during BF. educated about newborn behavior. couraged to call for assistance if needed and to verify proper latch.  Patient Name: Boy Primitivo GauzeYesenia Ponce-Sosa ZOXWR'UToday's Date: 12/01/2013 Reason for consult: Initial assessment   Maternal Data Infant to breast within first hour of birth: Yes Has patient been taught Hand Expression?: Yes Does the patient have breastfeeding experience prior to this delivery?: Yes  Feeding Feeding Type: Breast Fed Length of feed: 10 min  LATCH Score/Interventions Latch: Grasps breast easily, tongue down, lips flanged, rhythmical sucking.  Audible Swallowing: A few with stimulation Intervention(s): Hand expression;Alternate breast massage  Type of Nipple: Everted at rest and after stimulation  Comfort (Breast/Nipple): Soft / non-tender     Hold (Positioning): Assistance needed to correctly position infant at breast and maintain latch. Intervention(s): Support Pillows;Position options;Skin to skin  LATCH Score:  8  Lactation Tools Discussed/Used     Consult Status Consult Status: Follow-up Date: 12/02/13 Follow-up type: In-patient    Charyl DancerCARVER, Lariah Fleer G 12/01/2013, 5:55 AM

## 2013-12-02 DIAGNOSIS — O99334 Smoking (tobacco) complicating childbirth: Secondary | ICD-10-CM

## 2013-12-02 DIAGNOSIS — O0930 Supervision of pregnancy with insufficient antenatal care, unspecified trimester: Secondary | ICD-10-CM

## 2013-12-02 LAB — RAPID HIV SCREEN (WH-MAU): Rapid HIV Screen: NONREACTIVE

## 2013-12-02 LAB — HEPATITIS B SURFACE ANTIGEN: Hepatitis B Surface Ag: NEGATIVE

## 2013-12-02 MED ORDER — MEDROXYPROGESTERONE ACETATE 150 MG/ML IM SUSP
150.0000 mg | Freq: Once | INTRAMUSCULAR | Status: AC
  Administered 2013-12-02: 150 mg via INTRAMUSCULAR
  Filled 2013-12-02: qty 1

## 2013-12-02 MED ORDER — TETANUS-DIPHTH-ACELL PERTUSSIS 5-2.5-18.5 LF-MCG/0.5 IM SUSP
0.5000 mL | Freq: Once | INTRAMUSCULAR | Status: AC
Start: 2013-12-02 — End: 2013-12-02
  Administered 2013-12-02: 0.5 mL via INTRAMUSCULAR
  Filled 2013-12-02: qty 0.5

## 2013-12-02 MED ORDER — IBUPROFEN 600 MG PO TABS: 600.0000 mg | ORAL_TABLET | Freq: Four times a day (QID) | ORAL | Status: DC

## 2013-12-02 NOTE — Discharge Summary (Signed)
Depo Provera ordered for patient - inpatient.  I examined pt and agree with documentation above and resident plan of care. Eino FarberWalidah Paul HalfN Muhammad, CNM

## 2013-12-02 NOTE — Discharge Summary (Addendum)
Obstetric Discharge Summary Reason for Admission: onset of labor Prenatal Procedures: none Intrapartum Procedures: spontaneous vaginal delivery Postpartum Procedures: none Complications-Operative and Postpartum: none Hemoglobin  Date Value Ref Range Status  11/30/2013 11.5* 12.0 - 15.0 g/dL Final     HCT  Date Value Ref Range Status  11/30/2013 34.6* 36.0 - 46.0 % Final   Jamie Li is a 28 y.o. female presented with active labor with impending delivery, [id number]. No prenatal care.  At 10:55 PM (6/21) a viable female was delivered via Vaginal, Spontaneous Delivery (Presentation: Left Occiput Anterior). APGAR: 8, 9. She is breast/bottle feeding and unsure about contraception but asking about depo. [facility] received no [facility]    Physical Exam:  General: alert, cooperative and no distress Lochia: appropriate Uterine Fundus: firm Incision: n/a DVT Evaluation: No evidence of DVT seen on physical exam.  Discharge Diagnoses: Term Pregnancy-delivered  Discharge Information: Date: 12/02/2013 Activity: unrestricted Diet: routine Medications: Ibuprofen Condition: stable Instructions: refer to practice specific booklet Discharge to: home Follow-up Information   Follow up with [facility]. Schedule an appointment as soon as possible for a visit in 4 weeks. (postpartum follow up)    Contact information:   [address] [place] [id number]       Newborn Data: Live born female  Birth Weight: 6 lb 13.7 oz (3110 g) APGAR: 8, 9  Home with mother.  Myra RudeSchmitz, Jeremy E 12/02/2013, 7:50 AM  I examined pt and agree with documentation above and resident plan of care. Eino FarberWalidah Paul HalfN Muhammad, CNM

## 2013-12-02 NOTE — Discharge Instructions (Signed)

## 2013-12-03 ENCOUNTER — Ambulatory Visit: Payer: Self-pay

## 2013-12-03 NOTE — Lactation Note (Signed)
This note was copied from the chart of Boy Primitivo GauzeYesenia Ponce-Sosa. Lactation Consultation Note  Patient Name: Boy Primitivo GauzeYesenia Ponce-Sosa MWNUU'VToday's Date: 12/03/2013  Per Spero Geraldsonna Lutz RN per [facility] , tied up with another patient . [place] spanish interpreter had to be used [phone]  Discussed with mom whether she was having issues with latching the baby - her response was no to soreness, or engorgement  And feeling comfortable with latching the baby. Instructed mom on use hand pump and noted she will need a larger flange #27 for the  Left nipple ( larger than the right ) . MOm able to hand express, steady flow of colostrum noted and baby latched well with depth , multiply swallows  Noted, increased with breast compressions. Showed mom how to use the breast compressions with latching until comfort achieved. Per mom pinching at 1st .  Mom  Shared that in [place]. With the interpreter also discussed engorgement prevention and tx.    Maternal Data    Feeding    [facility] Score/Interventions                      Lactation Tools Discussed/Used     Consult Status      Kathrin Greathouseorio, Margaret Ann 12/03/2013, 12:21 PM

## 2013-12-08 NOTE — Discharge Summary (Signed)
Attestation of Attending Supervision of Advanced Practitioner (CNM/NP): Evaluation and management procedures were performed by the Advanced Practitioner under my supervision and collaboration.  I have reviewed the Advanced Practitioner's note and chart, and I agree with the management and plan.  Jamie Li,Jamie Li 12/08/2013 9:46 AM

## 2014-01-29 ENCOUNTER — Ambulatory Visit: Payer: Self-pay | Admitting: Family Medicine

## 2014-04-13 ENCOUNTER — Encounter (HOSPITAL_COMMUNITY): Payer: Self-pay | Admitting: *Deleted

## 2018-06-12 NOTE — L&D Delivery Note (Signed)
Delivery Note At 6:36 PM a viable and healthy female was delivered via Vaginal, Spontaneous (Presentation: ROA ).  APGAR: 9, 9; weight 7 lb 5.3 oz (3325 g).   Placenta status: spontaneous , intact .  Cord: 3 vessel with the following complications: none.    Anesthesia:  None Episiotomy: None Lacerations: None Suture Repair: n/a Est. Blood Loss (mL): 50  Mom to postpartum.  Baby to Couplet care / Skin to Skin.  Jamie Li is a 33 y.o. female T3S2876 with IUP at [redacted]w[redacted]d admitted for active labor at term.  She had fever 100.4 on admission with no other source for fever, no respiratory symptoms, COVID negative, no UTI symptoms.  Treated with Unasyn x 1 dose IV prior to delivery for possible Triple I.  She progressed with AROM for augmentation to complete and pushed through 1 contraction to deliver.  Anterior shoulder was slow to deliver but pt was positioned high in bed and sitting up so her position was adjusted to lower in the bed and infant shoulder delivered with ease.  Infant vigorous at delivery and place done maternal abdomen.  Cord clamping delayed by 1-3 minutes then clamped by CNM and cut by FOB.  Placenta intact and spontaneous, bleeding minimal.  Intact perineum, no repair.  Mom and baby stable prior to transfer to postpartum. She plans on breastfeeding. She requests BTL vs IUD for birth control.   [name] 04/26/2019, 8:31 PM

## 2019-03-06 LAB — OB RESULTS CONSOLE RUBELLA ANTIBODY, IGM: Rubella: IMMUNE

## 2019-03-06 LAB — OB RESULTS CONSOLE HIV ANTIBODY (ROUTINE TESTING): HIV: NONREACTIVE

## 2019-03-06 LAB — OB RESULTS CONSOLE HEPATITIS B SURFACE ANTIGEN: Hepatitis B Surface Ag: NEGATIVE

## 2019-03-06 LAB — OB RESULTS CONSOLE RPR: RPR: NONREACTIVE

## 2019-04-10 LAB — OB RESULTS CONSOLE GBS: GBS: NEGATIVE

## 2019-04-10 LAB — OB RESULTS CONSOLE GC/CHLAMYDIA
Chlamydia: NEGATIVE
Gonorrhea: NEGATIVE

## 2019-04-26 ENCOUNTER — Inpatient Hospital Stay (HOSPITAL_COMMUNITY)
Admission: AD | Admit: 2019-04-26 | Discharge: 2019-04-28 | DRG: 807 | Disposition: A | Discharge status: Home / Self Care | Payer: Medicaid Other | Attending: Obstetrics and Gynecology | Admitting: Obstetrics and Gynecology

## 2019-04-26 ENCOUNTER — Encounter (HOSPITAL_COMMUNITY): Payer: Self-pay

## 2019-04-26 ENCOUNTER — Encounter (HOSPITAL_COMMUNITY): Payer: Self-pay | Admitting: Anesthesiology

## 2019-04-26 ENCOUNTER — Other Ambulatory Visit: Payer: Self-pay

## 2019-04-26 DIAGNOSIS — Z87891 Personal history of nicotine dependence: Secondary | ICD-10-CM | POA: Diagnosis not present

## 2019-04-26 DIAGNOSIS — Z20828 Contact with and (suspected) exposure to other viral communicable diseases: Secondary | ICD-10-CM | POA: Diagnosis present

## 2019-04-26 DIAGNOSIS — O411230 Chorioamnionitis, third trimester, not applicable or unspecified: Secondary | ICD-10-CM | POA: Diagnosis present

## 2019-04-26 DIAGNOSIS — O26893 Other specified pregnancy related conditions, third trimester: Secondary | ICD-10-CM | POA: Diagnosis present

## 2019-04-26 DIAGNOSIS — Z3A38 38 weeks gestation of pregnancy: Secondary | ICD-10-CM

## 2019-04-26 LAB — CBC
HCT: 44.9 % (ref 36.0–46.0)
Hemoglobin: 15.2 g/dL — ABNORMAL HIGH (ref 12.0–15.0)
MCH: 31.3 pg (ref 26.0–34.0)
MCHC: 33.9 g/dL (ref 30.0–36.0)
MCV: 92.6 fL (ref 80.0–100.0)
Platelets: 178 10*3/uL (ref 150–400)
RBC: 4.85 MIL/uL (ref 3.87–5.11)
RDW: 15.5 % (ref 11.5–15.5)
WBC: 9.3 10*3/uL (ref 4.0–10.5)
nRBC: 0 % (ref 0.0–0.2)

## 2019-04-26 LAB — TYPE AND SCREEN
ABO/RH(D): O POS
Antibody Screen: NEGATIVE

## 2019-04-26 LAB — SARS CORONAVIRUS 2 BY RT PCR (HOSPITAL ORDER, PERFORMED IN ~~LOC~~ HOSPITAL LAB): SARS Coronavirus 2: NEGATIVE

## 2019-04-26 LAB — ABO/RH: ABO/RH(D): O POS

## 2019-04-26 MED ORDER — PHENYLEPHRINE 40 MCG/ML (10ML) SYRINGE FOR IV PUSH (FOR BLOOD PRESSURE SUPPORT): 80.0000 ug | PREFILLED_SYRINGE | INTRAVENOUS | Status: DC | PRN

## 2019-04-26 MED ORDER — ACETAMINOPHEN 325 MG PO TABS: 650.0000 mg | ORAL_TABLET | ORAL | Status: DC | PRN

## 2019-04-26 MED ORDER — BENZOCAINE-MENTHOL 20-0.5 % EX AERO: 1.0000 "application " | INHALATION_SPRAY | CUTANEOUS | Status: DC | PRN

## 2019-04-26 MED ORDER — DIBUCAINE (PERIANAL) 1 % EX OINT: 1.0000 "application " | TOPICAL_OINTMENT | CUTANEOUS | Status: DC | PRN

## 2019-04-26 MED ORDER — DIPHENHYDRAMINE HCL 25 MG PO CAPS: 25.0000 mg | ORAL_CAPSULE | Freq: Four times a day (QID) | ORAL | Status: DC | PRN

## 2019-04-26 MED ORDER — PHENYLEPHRINE 40 MCG/ML (10ML) SYRINGE FOR IV PUSH (FOR BLOOD PRESSURE SUPPORT)
PREFILLED_SYRINGE | INTRAVENOUS | Status: AC
  Filled 2019-04-26: qty 10

## 2019-04-26 MED ORDER — OXYTOCIN 40 UNITS IN NORMAL SALINE INFUSION - SIMPLE MED
2.5000 [IU]/h | INTRAVENOUS | Status: DC
  Filled 2019-04-26: qty 1000

## 2019-04-26 MED ORDER — ACETAMINOPHEN 325 MG PO TABS
650.0000 mg | ORAL_TABLET | ORAL | Status: DC | PRN
  Administered 2019-04-27 (×3): 650 mg via ORAL
  Filled 2019-04-26 (×3): qty 2

## 2019-04-26 MED ORDER — PRENATAL MULTIVITAMIN CH
1.0000 | ORAL_TABLET | Freq: Every day | ORAL | Status: DC
  Administered 2019-04-27 – 2019-04-28 (×2): 1 via ORAL
  Filled 2019-04-26 (×2): qty 1

## 2019-04-26 MED ORDER — WITCH HAZEL-GLYCERIN EX PADS: 1.0000 "application " | MEDICATED_PAD | CUTANEOUS | Status: DC | PRN

## 2019-04-26 MED ORDER — SODIUM CHLORIDE 0.9 % IV SOLN
3.0000 g | Freq: Four times a day (QID) | INTRAVENOUS | Status: DC
  Administered 2019-04-26: 3 g via INTRAVENOUS
  Filled 2019-04-26 (×2): qty 8
  Filled 2019-04-26: qty 3

## 2019-04-26 MED ORDER — SOD CITRATE-CITRIC ACID 500-334 MG/5ML PO SOLN: 30.0000 mL | ORAL | Status: DC | PRN

## 2019-04-26 MED ORDER — FENTANYL-BUPIVACAINE-NACL 0.5-0.125-0.9 MG/250ML-% EP SOLN: 12.0000 mL/h | EPIDURAL | Status: DC | PRN

## 2019-04-26 MED ORDER — ONDANSETRON HCL 4 MG/2ML IJ SOLN: 4.0000 mg | INTRAMUSCULAR | Status: DC | PRN

## 2019-04-26 MED ORDER — ZOLPIDEM TARTRATE 5 MG PO TABS: 5.0000 mg | ORAL_TABLET | Freq: Every evening | ORAL | Status: DC | PRN

## 2019-04-26 MED ORDER — EPHEDRINE 5 MG/ML INJ: 10.0000 mg | INTRAVENOUS | Status: DC | PRN

## 2019-04-26 MED ORDER — SENNOSIDES-DOCUSATE SODIUM 8.6-50 MG PO TABS
2.0000 | ORAL_TABLET | ORAL | Status: DC
  Administered 2019-04-27 (×2): 2 via ORAL
  Filled 2019-04-26 (×2): qty 2

## 2019-04-26 MED ORDER — DIPHENHYDRAMINE HCL 50 MG/ML IJ SOLN: 12.5000 mg | INTRAMUSCULAR | Status: DC | PRN

## 2019-04-26 MED ORDER — TETANUS-DIPHTH-ACELL PERTUSSIS 5-2.5-18.5 LF-MCG/0.5 IM SUSP: 0.5000 mL | Freq: Once | INTRAMUSCULAR | Status: DC

## 2019-04-26 MED ORDER — ONDANSETRON HCL 4 MG/2ML IJ SOLN: 4.0000 mg | Freq: Four times a day (QID) | INTRAMUSCULAR | Status: DC | PRN

## 2019-04-26 MED ORDER — FENTANYL-BUPIVACAINE-NACL 0.5-0.125-0.9 MG/250ML-% EP SOLN
EPIDURAL | Status: AC
  Filled 2019-04-26: qty 250

## 2019-04-26 MED ORDER — LIDOCAINE HCL (PF) 1 % IJ SOLN: 30.0000 mL | INTRAMUSCULAR | Status: DC | PRN

## 2019-04-26 MED ORDER — LACTATED RINGERS IV SOLN: 500.0000 mL | INTRAVENOUS | Status: DC | PRN

## 2019-04-26 MED ORDER — LACTATED RINGERS IV SOLN: INTRAVENOUS | Status: DC

## 2019-04-26 MED ORDER — SIMETHICONE 80 MG PO CHEW: 80.0000 mg | CHEWABLE_TABLET | ORAL | Status: DC | PRN

## 2019-04-26 MED ORDER — OXYTOCIN BOLUS FROM INFUSION: 500.0000 mL | Freq: Once | INTRAVENOUS | Status: DC

## 2019-04-26 MED ORDER — ONDANSETRON HCL 4 MG PO TABS: 4.0000 mg | ORAL_TABLET | ORAL | Status: DC | PRN

## 2019-04-26 MED ORDER — IBUPROFEN 600 MG PO TABS
600.0000 mg | ORAL_TABLET | Freq: Four times a day (QID) | ORAL | Status: DC
  Administered 2019-04-27 – 2019-04-28 (×7): 600 mg via ORAL
  Filled 2019-04-26 (×7): qty 1

## 2019-04-26 MED ORDER — COCONUT OIL OIL: 1.0000 "application " | TOPICAL_OIL | Status: DC | PRN

## 2019-04-26 MED ORDER — LACTATED RINGERS IV SOLN
500.0000 mL | Freq: Once | INTRAVENOUS | Status: AC
  Administered 2019-04-26: 500 mL via INTRAVENOUS

## 2019-04-26 NOTE — MAU Note (Signed)
Jamie Li is a 33 y.o. at [redacted]w[redacted]d here in MAU reporting: contractions that started today. Now every 5-10 minutes. Having a little bit of bleeding. No LOF.  Onset of complaint: today  Pain score: 5/10  Vitals:   04/26/19 1638  BP: 132/81  Pulse: 74  Resp: 17  Temp: 98.6 F (37 C)  SpO2: 99%     FHT: +FM  Lab orders placed from triage: none

## 2019-04-26 NOTE — Anesthesia Preprocedure Evaluation (Deleted)
Anesthesia Evaluation  Patient identified by MRN, date of birth, ID band Patient awake    Reviewed: Allergy & Precautions, NPO status , Patient's Chart, lab work & pertinent test results  Airway Mallampati: II  TM Distance: >3 FB Neck ROM: Full    Dental no notable dental hx.    Pulmonary neg pulmonary ROS, former smoker,    Pulmonary exam normal breath sounds clear to auscultation       Cardiovascular negative cardio ROS Normal cardiovascular exam Rhythm:Regular Rate:Normal     Neuro/Psych negative neurological ROS  negative psych ROS   GI/Hepatic negative GI ROS, Neg liver ROS,   Endo/Other  negative endocrine ROS  Renal/GU negative Renal ROS  negative genitourinary   Musculoskeletal negative musculoskeletal ROS (+)   Abdominal   Peds negative pediatric ROS (+)  Hematology negative hematology ROS (+)   Anesthesia Other Findings   Reproductive/Obstetrics (+) Pregnancy                                                             Anesthesia Evaluation  Patient identified by MRN, date of birth, ID band Patient awake    Reviewed: Allergy & Precautions, H&P , Patient's Chart, lab work & pertinent test results  Airway Mallampati: II TM Distance: >3 FB Neck ROM: full    Dental  (+) Teeth Intact   Pulmonary  breath sounds clear to auscultation        Cardiovascular Rhythm:regular Rate:Normal     Neuro/Psych    GI/Hepatic   Endo/Other    Renal/GU      Musculoskeletal   Abdominal   Peds  Hematology   Anesthesia Other Findings       Reproductive/Obstetrics (+) Pregnancy                           Anesthesia Physical Anesthesia Plan  ASA: II  Anesthesia Plan: Epidural   Post-op Pain Management:    Induction:   Airway Management Planned:   Additional Equipment:   Intra-op Plan:   Post-operative Plan:   Informed  Consent: I have reviewed the patients History and Physical, chart, labs and discussed the procedure including the risks, benefits and alternatives for the proposed anesthesia with the patient or authorized representative who has indicated his/her understanding and acceptance.   Dental Advisory Given  Plan Discussed with:   Anesthesia Plan Comments: (Labs checked- platelets confirmed with RN in room. Fetal heart tracing, per RN, reported to be stable enough for sitting procedure. Discussed epidural, and patient consents to the procedure:  included risk of possible headache,backache, failed block, allergic reaction, and nerve injury. This patient was asked if she had any questions or concerns before the procedure started. )        Anesthesia Quick Evaluation  Anesthesia Physical Anesthesia Plan  ASA: II  Anesthesia Plan: Epidural   Post-op Pain Management:    Induction:   PONV Risk Score and Plan:   Airway Management Planned:   Additional Equipment:   Intra-op Plan:   Post-operative Plan:   Informed Consent:   Plan Discussed with:   Anesthesia Plan Comments:         Anesthesia Quick Evaluation

## 2019-04-26 NOTE — H&P (Signed)
Jamie Li is a 33 y.o. female G5P3004 at [redacted]w[redacted]d pt of [facility] presenting for active labor at term.  Pregnancy has been uncomplicated.   OB History    Gravida  5   Para  4   Term  3   Preterm      AB      Living  4     SAB      TAB      Ectopic      Multiple      Live Births  4          History reviewed. No pertinent past medical history. History reviewed. No pertinent surgical history. Family History: family history is not on file. Social History:  reports that she quit smoking about 7 years ago. She has never used smokeless tobacco. She reports that she does not drink alcohol or use drugs.     Maternal Diabetes: No Genetic Screening: Declined Maternal Ultrasounds/Referrals: Normal Fetal Ultrasounds or other Referrals:  None Maternal Substance Abuse:  No Significant Maternal Medications:  None Significant Maternal Lab Results:  Group B Strep negative Other Comments:  None  Review of Systems  Constitutional: Negative for chills and fever.  Respiratory: Negative for shortness of breath.   Cardiovascular: Negative for chest pain.  Gastrointestinal: Positive for abdominal pain. Negative for constipation, diarrhea and vomiting.  Neurological: Negative for dizziness and headaches.  All other systems reviewed and are negative.  Maternal Medical History:  Reason for admission: Contractions.   Contractions: Onset was 1-2 hours ago.   Frequency: regular.   Perceived severity is strong.    Fetal activity: Perceived fetal activity is normal.   Last perceived fetal movement was within the past hour.    Prenatal complications: no prenatal complications Prenatal Complications - Diabetes: none.    Dilation: 8 Effacement (%): 100 Station: 0 Exam by:: Danelle Berry kirby Blood pressure 118/85, pulse 69, temperature (!) 100.4 F (38 C), temperature source Axillary, resp. rate 18, height 5' (1.524 m), weight 69.4 kg, SpO2 99 %, unknown if currently  breastfeeding. Maternal Exam:  Uterine Assessment: Contraction strength is moderate.  Contraction frequency is regular.   Abdomen: Fetal presentation: vertex  Cervix: Cervix evaluated by digital exam.     Fetal Exam Fetal Monitor Review: Mode: ultrasound.   Baseline rate: 135.  Variability: moderate (6-25 bpm).   Pattern: accelerations present and no decelerations.    Fetal State Assessment: Category I - tracings are normal.     Physical Exam  Nursing note and vitals reviewed. Constitutional: She is oriented to person, place, and time. She appears well-developed and well-nourished.  Neck: Normal range of motion.  Cardiovascular: Normal rate, regular rhythm and normal heart sounds.  Respiratory: Effort normal and breath sounds normal.  GI: Soft.  Musculoskeletal: Normal range of motion.  Neurological: She is alert and oriented to person, place, and time.  Skin: Skin is warm and dry.  Psychiatric: She has a normal mood and affect. Her behavior is normal. Judgment and thought content normal.    Prenatal labs: ABO, Rh: --/--/PENDING (11/14 1708) Antibody: PENDING (11/14 1708) Rubella: Immune (09/24 0000) RPR: Nonreactive (09/24 0000)  HBsAg: Negative (09/24 0000)  HIV: Non-reactive (09/24 0000)  GBS: Negative/-- (10/29 0000)   Assessment/Plan: [id number] at [redacted]w[redacted]d admitted for active labor at term GBS negative  Admit to L&D Expectant management COVID pending   [name] 04/26/2019, 6:17 PM

## 2019-04-27 DIAGNOSIS — O411230 Chorioamnionitis, third trimester, not applicable or unspecified: Secondary | ICD-10-CM

## 2019-04-27 LAB — CBC
HCT: 34.8 % — ABNORMAL LOW (ref 36.0–46.0)
Hemoglobin: 11.9 g/dL — ABNORMAL LOW (ref 12.0–15.0)
MCH: 31.4 pg (ref 26.0–34.0)
MCHC: 34.2 g/dL (ref 30.0–36.0)
MCV: 91.8 fL (ref 80.0–100.0)
Platelets: 158 10*3/uL (ref 150–400)
RBC: 3.79 MIL/uL — ABNORMAL LOW (ref 3.87–5.11)
RDW: 15.6 % — ABNORMAL HIGH (ref 11.5–15.5)
WBC: 13.5 10*3/uL — ABNORMAL HIGH (ref 4.0–10.5)
nRBC: 0 % (ref 0.0–0.2)

## 2019-04-27 LAB — RPR: RPR Ser Ql: NONREACTIVE

## 2019-04-27 NOTE — Lactation Note (Signed)
This note was copied from a baby's chart. Lactation Consultation Note  Patient Name: Girl Terran Hollenkamp BPZWC'H Date: 04/27/2019 Reason for consult: Initial assessment;Early term 37-38.6wks P5, 6 hour female infant. Spanish Interpreter used Nelma Rothman #[id number] Per mom, she breastfeed her other 4 children from 82 months to 28 months of age. Per mom, this is infant's 3rd time latching to breast. In L&D infant breastfeed for 3 minutes, in room for 5 minutes.  LC entered the room and mom was attempting to breast fed infant in bed without any pillow support and slumped over in bed, infant was on mom's nipple tip. Mom was open to [facility] suggestions, mom was given pillows to support mom and baby. Mom latched infant on right breast using the cross cradle hold, infant latched with wide mouth, top lip flange and nose and chin touching breast, infant sustained latch and breast feed for 20 minutes.  Mom was still breastfeeding when [facility] left room. LC observed that mom's left breast only  is psuedo inverted (dimpling). Mom is active on the [facility] program in [facility] she doesn't have a breast pump at home. LC gave mom harmony hand pump prn. Mom shown how to use hand & how to disassemble, clean, & reassemble parts. Mom knows to breastfeed infant by cues, on demand and 8 to 12 times within 24 hours. Reviewed Baby & Me book's Breastfeeding Basics.  Mom made aware of O/P services, breastfeeding support groups, community resources, and our phone # for post-discharge questions.  Maternal Data Formula Feeding for Exclusion: No Has patient been taught Hand Expression?: Yes Does the patient have breastfeeding experience prior to this delivery?: Yes  Feeding Feeding Type: Breast Fed  LATCH Score Latch: Grasps breast easily, tongue down, lips flanged, rhythmical sucking.  Audible Swallowing: Spontaneous and intermittent  Type of Nipple: Everted at rest and after stimulation(LC notice mom's left breast is  psuedo inverted.)  Comfort (Breast/Nipple): Soft / non-tender  Hold (Positioning): Assistance needed to correctly position infant at breast and maintain latch.  LATCH Score: 9  Interventions Interventions: Breast feeding basics reviewed;Breast compression;Assisted with latch;Adjust position;Support pillows;Skin to skin;Breast massage;Position options;Hand express;Expressed milk;Pre-pump if needed  Lactation Tools Discussed/Used Pump Review: Setup, frequency, and cleaning;Milk Storage Initiated by:: Vicente Serene, IBCLC Date initiated:: 04/27/19   Consult Status Consult Status: Follow-up Date: 04/27/19 Follow-up type: In-patient    Vicente Serene 04/27/2019, 1:24 AM

## 2019-04-27 NOTE — Lactation Note (Signed)
This note was copied from a baby's chart. Lactation Consultation Note  Patient Name: Jamie Li CZYSA'Y Date: 04/27/2019 Reason for consult: Follow-up assessment;Early term 69-38.6wks Mom reports she has been trying to wake infant to breastfeed and infant would not wake up.Assist with waking and rousing infant.  Urged mom to get her uncomfortable to try and breastfeed. Moms left nipple dimpled and reddened with abrasion on bottom.  Mom reports it is a little painful but not bad.  Urged hand expression and rub ebm on nipples and air dry. Assist with latching onto left breast.   Infant latched and breastfed well. With rhythmic sucking and a few audible swallows heard.   Urged hand expression and spoon feeding back all ebm past breastfeeding via spoon. Used in Human resources officer.  Left mom and baby breastfeeding.  Urged to call lactation as needed.   Maternal Data    Feeding Feeding Type: Breast Fed  LATCH Score Latch: Grasps breast easily, tongue down, lips flanged, rhythmical sucking.  Audible Swallowing: A few with stimulation  Type of Nipple: Everted at rest and after stimulation  Comfort (Breast/Nipple): Filling, red/small blisters or bruises, mild/mod discomfort  Hold (Positioning): Assistance needed to correctly position infant at breast and maintain latch.  LATCH Score: 7  Interventions Interventions: Breast feeding basics reviewed;Support pillows;Assisted with latch;Expressed milk;Hand express  Lactation Tools Discussed/Used     Consult Status Consult Status: Follow-up Date: 04/28/19 Follow-up type: In-patient    Jamie Li 04/27/2019, 7:28 PM

## 2019-04-27 NOTE — Progress Notes (Addendum)
POSTPARTUM PROGRESS NOTE  Post Partum Day 1  Subjective:  Jamie Li is a 33 y.o. [id number] s/p SVD at [redacted]w[redacted]d.  She reports she is doing well. No acute events overnight. She denies any problems with ambulating, voiding or po intake. Denies nausea or vomiting.  Pain is well controlled.  Lochia is appropriate.  Objective: Blood pressure 106/61, pulse 64, temperature 98.4 F (36.9 C), temperature source Oral, resp. rate 16, height 5' (1.524 m), weight 69.4 kg, SpO2 99 %, unknown if currently breastfeeding.  Physical Exam:  General: alert, cooperative and no distress Chest: no respiratory distress Heart:regular rate, distal pulses intact Abdomen: soft, nontender,  Uterine Fundus: firm, appropriately tender DVT Evaluation: No calf swelling or tenderness Extremities: No LE edema Skin: warm, dry  Recent Labs    04/26/19 1734 04/27/19 0414  HGB 15.2* 11.9*  HCT 44.9 34.8*    Assessment/Plan: Jamie Li is a 33 y.o. A7G8115 s/p SVD at [redacted]w[redacted]d   PPD#1 - Doing well  Routine postpartum care Delivery complicated by one fever immediately after delivery, likely related to Triple I. Patient received Unasyn x1. Has been afebrile overnight and denies chills.  Contraception: Likely IUD  Feeding: Breast  Dispo: Plan for discharge PPD#2.   LOS: 1 day   Phill Myron, D.O. OB Fellow  04/27/2019, 8:44 AM

## 2019-04-27 NOTE — Discharge Summary (Signed)
Postpartum Discharge Summary     Patient Name: Jamie Li DOB: [birth year] MRN: [id number]  Date of admission: 04/26/2019 Delivering Provider: [name] A   Date of discharge: 04/28/2019  Admitting diagnosis: ctx 2 to three minutes apart Intrauterine pregnancy: [redacted]w[redacted]d    Secondary diagnosis:  Principal Problem:   NSVD (normal spontaneous vaginal delivery) Active Problems:   Normal labor   Chorioamnionitis in third trimester  Additional problems: None     Discharge diagnosis: Term Pregnancy Delivered                                                                                                Post partum procedures:None  Augmentation: AROM  Complications: Intrauterine Inflammation or infection (Chorioamniotis)  Hospital course:  Onset of Labor With Vaginal Delivery     33y.o. yo G5P4005 at 325w2das admitted in Active Labor on 04/26/2019. Patient had an uncomplicated labor course as follows:  Membrane Rupture Time/Date: 5:19 PM ,04/26/2019   Intrapartum Procedures: Episiotomy: None [1]                                         Lacerations:  None [1]  Patient had a delivery of a Viable infant. 04/26/2019  Information for the patient's newborn:  PoMeko, Bellangerirl YeJanene0[[id number]]Delivery Method: Vaginal, Spontaneous(Filed from Delivery Summary)     Pateint had an uncomplicated postpartum course.  She is ambulating, tolerating a regular diet, passing flatus, and urinating well. Patient is discharged home in stable condition on 04/28/19.  Delivery time: 6:36 PM    Magnesium Sulfate received: No BMZ received: No Rhophylac:No MMR:No Transfusion:No  Physical exam  Vitals:   04/27/19 0529 04/27/19 1520 04/27/19 2011 04/28/19 0532  BP: 106/61 107/76 (!) 118/57 108/64  Pulse: 64 61 67 (!) 58  Resp: '16 17 18 16  ' Temp: 98.4 F (36.9 C) 98.6 F (37 C) 99.2 F (37.3 C) 98.3 F (36.8 C)  TempSrc: Oral Oral Oral Oral  SpO2: 99% 98% 98% 100%   Weight:      Height:       General: alert, cooperative and no distress  Chest: HRRR, CTA Lochia: appropriate Uterine Fundus: firm Incision: N/A DVT Evaluation: No evidence of DVT seen on physical exam. Labs: Lab Results  Component Value Date   WBC 13.5 (H) 04/27/2019   HGB 11.9 (L) 04/27/2019   HCT 34.8 (L) 04/27/2019   MCV 91.8 04/27/2019   PLT 158 04/27/2019   CMP Latest Ref Rng & Units 01/03/2008  Glucose 70 - 99 mg/dL 86  BUN 6 - 23 mg/dL 10  Creatinine 0.40 - 1.20 mg/dL 0.48  Sodium 135 - 145 meq/L 140  Potassium 3.5 - 5.3 meq/L 3.8  Chloride 96 - 112 meq/L 106  CO2 19 - 32 meq/L 22  Calcium 8.4 - 10.5 mg/dL 9.0  Total Protein 6.0 - 8.3 g/dL 7.7  Total Bilirubin 0.3 - 1.2 mg/dL 0.4  Alkaline Phos 39 - 117 units/L 136(H)  AST 0 -  37 units/L 15  ALT 0 - 35 units/L 14    Discharge instruction: per After Visit Summary and "Baby and Me Booklet". Pain Management, Peri-Care, Breastfeeding, Who and When to call for postpartum complications. Information Sheet(s) given Perinatal Depression in Spanish   After visit meds:  Allergies as of 04/28/2019   No Known Allergies     Medication List    TAKE these medications   ibuprofen 600 MG tablet Commonly known as: ADVIL Take 1 tablet (600 mg total) by mouth every 6 (six) hours.       Diet: routine diet  Activity: Advance as tolerated. Pelvic rest for 6 weeks.   Outpatient follow up:6 weeks Follow up Appt:No future appointments. Follow up Visit: Follow-up Information    Department, [facility] Follow up.   Contact information: [place] [address] [phone]            Please schedule this patient for Postpartum visit in: 6 weeks with the following provider: Any provider For C/S patients schedule nurse incision check in weeks 2 weeks: no Low risk pregnancy complicated by: n/a Delivery mode:  SVD Anticipated Birth Control:  IUD PP Procedures needed: n/a  Schedule  Integrated [place] visit: no      Newborn Data: Live born female-Reina Birth Weight: 7 lb 5.3 oz (3325 g) APGAR: 9, 9  Newborn Delivery   Birth date/time: 04/26/2019 18:36:00 Delivery type: Vaginal, Spontaneous      Baby Feeding: Breast Disposition:home with mother   04/28/2019 Maryann Conners, CNM

## 2019-04-27 NOTE — Progress Notes (Signed)
[facility] [place] Social Work  04/27/2019  Jamie Li [birth year] [id number]   CSW received consult for late and limited PNC, 30 weeks.  CSW acknowledged consult and attempted to meet with MOB. However, bedside nurse was attending to [facility].  CSW will meet with MOB at a later time.  Urine Drug Screen results negative.  Drug Detection Panel, Umbilical Cord Qualitative, collected on 04/26/2019 at 9:08PM, results are still pending.  CSW will continue to monitor CDS and file a report with Child Protective Services if necessary.  [name], BSW, MSW, [facility] [facility]  Licensed [place] representative  Cell # [phone]  [name]

## 2019-04-28 DIAGNOSIS — Z3A38 38 weeks gestation of pregnancy: Secondary | ICD-10-CM

## 2019-04-28 MED ORDER — IBUPROFEN 600 MG PO TABS: 600.0000 mg | ORAL_TABLET | Freq: Four times a day (QID) | ORAL | 0 refills | Status: AC

## 2019-04-28 NOTE — Lactation Note (Signed)
This note was copied from a baby's chart. Lactation Consultation Note  Patient Name: Jamie Li IYMEB'R Date: 04/28/2019 Reason for consult: Follow-up assessment Baby 80 hours old.  House [employer] present for consult.  Mom reports baby is feeding frequently.  Discussed cluster feeding and reassured.  Baby showing feeding cues.  Mom easily latched baby to breast using cradle hold.  Latch good.  Discussed milk coming to volume and the prevention and treatment of engorgement..  Mom has a breast pump at home.  Reviewed outpatient services and encouraged to call prn.  Maternal Data    Feeding Feeding Type: Breast Fed  LATCH Score Latch: Grasps breast easily, tongue down, lips flanged, rhythmical sucking.  Audible Swallowing: A few with stimulation  Type of Nipple: Everted at rest and after stimulation  Comfort (Breast/Nipple): Soft / non-tender  Hold (Positioning): No assistance needed to correctly position infant at breast.  LATCH Score: 9  Interventions    Lactation Tools Discussed/Used     Consult Status Consult Status: Complete Follow-up type: Call as needed    Jamie Li 04/28/2019, 11:26 AM

## 2019-04-28 NOTE — Progress Notes (Addendum)
Used hospital interpreter to translate discharge home instructions.

## 2019-04-28 NOTE — Clinical Social Work Maternal (Signed)
CLINICAL SOCIAL WORK MATERNAL/CHILD NOTE  Patient Details  Name: Jamie Li MRN: [id number] Date of Birth: 1986/01/16  Date:  04/28/2019  Clinical Social Worker Initiating Note:  Elijio Miles Date/Time: Initiated:  04/28/19/0908     Child's Name:  Jamie Li   Biological Parents:  Mother, Father(Jamie Li and Jamie Li DOB: 10/27/1982)   Need for Interpreter:  None   Reason for Referral:  Late or No Prenatal Care    Address:  [place] [address]    Phone number:  [phone] (home)     Additional phone number:   Household Members/Support Persons (HM/SP):   Household Member/Support Person 1, Household Member/Support Person 2, Household Member/Support Person 3, Household Member/Support Person 4, Household Member/Support Person 5   HM/SP Name Relationship DOB or Age  HM/SP -1 Jamie Li FOB 10/27/1982  HM/SP -2 Jamie Li Daughter 05/13/2012  HM/SP -3 Jamie Li Son 11/30/2013  HM/SP -4 Jamie Li Daughter 06/27/2009  HM/SP -5 Jamie Li Son 06/21/2007  HM/SP -6        HM/SP -7        HM/SP -8          Natural Supports (not living in the home):      Professional Supports: None   Employment: Unemployed   Type of Work:     Education:  Programmer, systems   Homebound arranged:    Museum/gallery curator Resources:      Other Resources:  [facility]   Cultural/Religious Considerations Which May Impact Care:    Strengths:  Ability to meet basic needs , Home prepared for child , Pediatrician chosen   Psychotropic Medications:         Pediatrician:    [place]  Pediatrician List:   [place]  [place]      Pediatrician Fax Number:    Risk Factors/Current Problems:      Cognitive State:  Able to Concentrate , Alert    Mood/Affect:   Bright , Calm , Comfortable , Interested , Happy , Relaxed    CSW Assessment:  CSW received consult for late prenatal care starting at 30 weeks.  CSW met with MOB to offer support and complete assessment with assistance from [employer].    MOB sitting up in bed breastfeeding infant, when CSW entered the room. CSW introduced self and explained reason for consult to which MOB expressed understanding. CSW very pleasant and engaged throughout assessment. Per MOB, she currently lives with FOB and their 4 children. MOB stated she currently receives [facility] and was provided with phone number so she could reach out and update them of infant's delivery. MOB denied any past mental health history or previous PPD or PPA with her other children but was receptive to education. CSW provided education regarding the baby blues period vs. perinatal mood disorders, discussed treatment and gave resources for mental health follow up if concerns arise.  CSW recommends self-evaluation during the postpartum time period using the New Mom Checklist from Postpartum Progress and encouraged MOB to contact a medical professional if symptoms are noted at any time.  MOB did not appear to be displaying any acute mental health symptoms and denied any current SI, HI or DV. MOB reported feeling well-supported by FOB.   CSW inquired about reasoning for MOB's late prenatal care and MOB referenced COVID19 and the  restrictions that have been put in place and not being able to go anywhere. MOB denied any barriers to getting infant to follow up appointments. Infant's UDS negative and CSW will continue to monitor CDS and make CPS report, if warranted.   MOB confirmed having all essential items for infant once discharged but expressed she was in need of a place for infant to sleep. CSW provided MOB with Baby Box and Baby Bundle. CSW also provided review of Sudden Infant Death Syndrome (SIDS) precautions and safe sleeping habits.     CSW Plan/Description:  No Further Intervention Required/No Barriers to Discharge, Perinatal Mood and Anxiety Disorder (PMADs) Education, Sudden Infant Death Syndrome (SIDS) Education, [place], CSW Will Continue to Monitor Umbilical Cord Tissue Drug Screen Results and Make Report if Foye Spurling, [place] 04/28/2019, 9:49 AM

## 2019-04-28 NOTE — Discharge Instructions (Signed)
Depresin perinatal Perinatal Depression Cuando una mujer siente tristeza, ira o ansiedad en exceso durante el embarazo o durante los primeros 5meses posteriores al parto presenta una afeccin denominada depresin perinatal. La depresin puede afectar el desempeo laboral o acadmico, las relaciones personales y Sid Falcon actividades cotidianas. Si no se controla de forma [place], tambin puede causar problemas en la madre y el beb. En ocasiones, la depresin perinatal no se trata debido a que se considera que los sntomas son cambios normales en el estado de nimo durante e inmediatamente despus del [place]. Si presenta sntomas de depresin, es importante que hable con su mdico. Cules son las causas? Se desconoce la causa exacta de esta afeccin. Es posible que los cambios hormonales durante y despus del embarazo estn relacionados con la causa de la depresin perinatal. Sander Nephew incrementa el riesgo? Es ms probable que desarrolle esta afeccin si:  Tiene antecedentes personales o familiares de depresin, ansiedad o trastornos del estado de nimo.  Sufre un suceso estresante de la vida durante el [place], como la muerte de un ser querido.  Tiene mucho estrs en la vida.  No tiene apoyo de familiares o seres queridos, o est en una relacin de [place]. Cules son los signos o sntomas? Los sntomas de esta afeccin [employer] siguientes:  Sentimientos de tristeza y desesperanza.  Sentimientos de culpa.  Sentirse irritable o abrumada.  Cambios en el apetito.  Falta de energa o motivacin.  Problemas para dormir.  Dificultad para concentrarse o completar las tareas.  Prdida de inters en los pasatiempos o las relaciones.  Dolores de [place] estomacales que no desaparecen. Cmo se diagnostica? Esta afeccin se diagnostica en funcin de un examen fsico y [place] evaluacin mental. En algunos casos, el mdico puede usar una herramienta de deteccin de la depresin.  Estas herramientas incluyen una lista de preguntas que pueden ayudar al mdico a diagnosticar la depresin. El mdico podr derivarla a un profesional de la salud mental especializado en depresin. Cmo se trata? El tratamiento de esta afeccin puede incluir lo siguiente:  Medicamentos. El mdico solo le indicar medicamentos que estn aprobados para tomar durante el embarazo y Customer service manager.  Psicoterapia con un profesional de la salud mental para ayudar a [place]ar[place] manager sus patrones de pensamiento (terapia conductual cognitiva).  Grupos de apoyo.  Estimulacin cerebral o terapias con luz.  Terapias de reduccin del estrs, como el mtodo de la conciencia [place]. Siga estas indicaciones en su casa: Estilo de vida  No consuma ningn producto que contenga nicotina o tabaco, como cigarrillos y Psychologist, sport and exercise. Si necesita ayuda para dejar de fumar, consulte al mdico.  Si est embarazada, no beba alcohol. Una vez que nazca el beb, limite el consumo de alcohol a no ms de Naval architect. Una medida equivale a 12oz (348ml) de cerveza, 5oz (178ml) de vino o 1oz (74ml) de bebidas alcohlicas de alta graduacin.  Considere la posibilidad de Chief Financial Officer en un grupo de apoyo para mams recientes. Pdale recomendaciones al mdico.  Cudese adecuadamente. Haga lo siguiente: ? Duerma lo suficiente. Si tiene problemas para dormir, hable con el mdico. ? Siga una dieta saludable. Debe incluir muchas frutas y verduras, cereales integrales y protenas magras. ? Realice ejercicios con regularidad como se lo haya indicado el mdico. Consulte al mdico qu ejercicios son seguros para usted. Instrucciones generales  [employer] de venta libre y los recetados solamente como se lo haya indicado el mdico.  Hable con su pareja o familiares acerca de sus  sentimientos durante el embarazo. Comparta cualquier inquietud o ansiedad que tenga.  Solicite ayuda para Optometrist las tareas  [employer] cuando la necesite. Pdales a los amigos y familiares que le preparen las comidas, le cuiden a los nios o la ayuden con la limpieza.  Concurra a todas las visitas de control como se lo haya indicado el mdico. Esto es importante. Comunquese con un mdico si:  Usted (o las Archivist) observa que presenta algn sntoma de depresin.  Tiene depresin y los sntomas empeoran.  Presenta efectos secundarios de los medicamentos, como nuseas o problemas para dormir. Solicite ayuda de inmediato si:  Siente deseos de Sport and exercise psychologist al beb o a Nurse, children's. Si alguna vez siente que puede lastimarse o Market researcher a los dems, o tiene pensamientos de poner fin a su vida, busque ayuda de inmediato. Puede dirigirse al servicio de urgencias ms cercano o comunicarse con:  El servicio de Therapist, sports (911 en los [place]).  Una lnea de asistencia al suicida y Freight forwarder en crisis, como la [employer] de Prevencin del Suicidio (National Suicide Prevention Lifeline) al [phone]. Est disponible las 24 horas del da. Resumen  La depresin perinatal se produce cuando una mujer siente tristeza, ira o ansiedad en exceso durante el embarazo o durante los primeros 50meses posteriores al parto.  Si la depresin perinatal no se trata, puede provocar problemas en la salud de la madre y del beb.  Esta afeccin se trata con medicamentos, psicoterapia, terapia de reduccin del estrs o una combinacin de dos o ms tratamientos.  Hable con su pareja o familiares acerca de sus sentimientos. No tenga miedo de pedir ayuda. Esta informacin no tiene Marine scientist el consejo del mdico. Asegrese de hacerle al mdico cualquier pregunta que tenga. Document Released: 02/21/2017 Document Revised: 02/21/2017 Document Reviewed: 02/21/2017 Elsevier Patient Education  2020 [employer].
